# Patient Record
Sex: Female | Born: 1975 | Race: White | Hispanic: No | Marital: Married | Smoking: Never smoker
Health system: Southern US, Community
[De-identification: ages and names within clinical notes are randomized; demographics above are authoritative.]

## PROBLEM LIST (undated history)

## (undated) ENCOUNTER — Inpatient Hospital Stay (HOSPITAL_COMMUNITY): Payer: Self-pay

## (undated) DIAGNOSIS — D509 Iron deficiency anemia, unspecified: Secondary | ICD-10-CM

## (undated) DIAGNOSIS — M545 Low back pain: Secondary | ICD-10-CM

## (undated) DIAGNOSIS — Z9884 Bariatric surgery status: Secondary | ICD-10-CM

## (undated) DIAGNOSIS — Z87442 Personal history of urinary calculi: Secondary | ICD-10-CM

## (undated) DIAGNOSIS — K2970 Gastritis, unspecified, without bleeding: Secondary | ICD-10-CM

## (undated) DIAGNOSIS — Z9889 Other specified postprocedural states: Secondary | ICD-10-CM

## (undated) DIAGNOSIS — R112 Nausea with vomiting, unspecified: Secondary | ICD-10-CM

## (undated) DIAGNOSIS — G8929 Other chronic pain: Secondary | ICD-10-CM

## (undated) DIAGNOSIS — M1990 Unspecified osteoarthritis, unspecified site: Secondary | ICD-10-CM

## (undated) HISTORY — PX: DIAGNOSTIC LAPAROSCOPY: SUR761

---

## 1995-04-22 HISTORY — PX: WRIST GANGLION EXCISION: SUR520

## 1997-04-21 HISTORY — PX: CRYOTHERAPY: SHX1416

## 1999-04-22 HISTORY — PX: WISDOM TOOTH EXTRACTION: SHX21

## 2001-04-21 HISTORY — PX: GASTRIC BYPASS: SHX52

## 2009-04-30 ENCOUNTER — Emergency Department (HOSPITAL_COMMUNITY): Admission: EM | Admit: 2009-04-30 | Discharge: 2009-04-30 | Payer: Self-pay | Admitting: Family Medicine

## 2009-12-31 ENCOUNTER — Emergency Department (HOSPITAL_COMMUNITY): Admission: EM | Admit: 2009-12-31 | Discharge: 2009-12-31 | Payer: Self-pay | Admitting: Internal Medicine

## 2010-07-04 LAB — POCT I-STAT, CHEM 8
BUN: 6 mg/dL (ref 6–23)
Calcium, Ion: 1.16 mmol/L (ref 1.12–1.32)
Chloride: 104 mEq/L (ref 96–112)
Creatinine, Ser: 0.9 mg/dL (ref 0.4–1.2)
Glucose, Bld: 89 mg/dL (ref 70–99)
TCO2: 23 mmol/L (ref 0–100)

## 2010-07-04 LAB — CBC
HCT: 35.6 % — ABNORMAL LOW (ref 36.0–46.0)
Hemoglobin: 11.2 g/dL — ABNORMAL LOW (ref 12.0–15.0)
MCH: 26.9 pg (ref 26.0–34.0)
MCHC: 31.5 g/dL (ref 30.0–36.0)
RDW: 13.6 % (ref 11.5–15.5)

## 2010-07-04 LAB — URINE CULTURE
Culture  Setup Time: 201109121603
Culture: NO GROWTH

## 2010-07-04 LAB — URINALYSIS, ROUTINE W REFLEX MICROSCOPIC
Bilirubin Urine: NEGATIVE
Hgb urine dipstick: NEGATIVE
Specific Gravity, Urine: 1.01 (ref 1.005–1.030)
Urobilinogen, UA: 0.2 mg/dL (ref 0.0–1.0)
pH: 6 (ref 5.0–8.0)

## 2010-07-04 LAB — TYPE AND SCREEN
ABO/RH(D): A POS
Antibody Screen: NEGATIVE

## 2010-07-04 LAB — COMPREHENSIVE METABOLIC PANEL
CO2: 23 mEq/L (ref 19–32)
Calcium: 9 mg/dL (ref 8.4–10.5)
Creatinine, Ser: 0.8 mg/dL (ref 0.4–1.2)
GFR calc Af Amer: >60 mL/min (ref >60)
GFR calc non Af Amer: >60 mL/min (ref >60)
Glucose, Bld: 90 mg/dL (ref 70–99)
Sodium: 137 mEq/L (ref 135–145)
Total Protein: 6.8 g/dL (ref 6.0–8.3)

## 2010-07-04 LAB — APTT: aPTT: 25 seconds (ref 24–37)

## 2010-07-04 LAB — PROTIME-INR: Prothrombin Time: 13.5 seconds (ref 11.6–15.2)

## 2012-02-26 ENCOUNTER — Encounter (HOSPITAL_COMMUNITY): Payer: Self-pay | Admitting: *Deleted

## 2012-02-26 NOTE — H&P (Signed)
Shaneese Tait  DICTATION # [id number] CSN# [id number]   [name], MD 02/26/2012 4:10 PM

## 2012-02-28 ENCOUNTER — Ambulatory Visit (HOSPITAL_COMMUNITY)
Admission: AD | Admit: 2012-02-28 | Payer: Federal, State, Local not specified - PPO | Source: Ambulatory Visit | Admitting: Obstetrics and Gynecology

## 2012-02-28 ENCOUNTER — Encounter (HOSPITAL_COMMUNITY): Payer: Self-pay

## 2012-02-28 ENCOUNTER — Encounter (HOSPITAL_COMMUNITY): Payer: Self-pay | Admitting: *Deleted

## 2012-02-28 ENCOUNTER — Ambulatory Visit (HOSPITAL_COMMUNITY)
Admission: AD | Admit: 2012-02-28 | Discharge: 2012-02-28 | Disposition: A | Discharge status: Home / Self Care | Payer: Federal, State, Local not specified - PPO | Source: Ambulatory Visit | Attending: Obstetrics and Gynecology | Admitting: Obstetrics and Gynecology

## 2012-02-28 ENCOUNTER — Encounter (HOSPITAL_COMMUNITY)
Admission: AD | Disposition: A | Discharge status: Home / Self Care | Payer: Self-pay | Source: Ambulatory Visit | Attending: Obstetrics and Gynecology

## 2012-02-28 ENCOUNTER — Inpatient Hospital Stay (HOSPITAL_COMMUNITY): Payer: Federal, State, Local not specified - PPO

## 2012-02-28 DIAGNOSIS — O021 Missed abortion: Secondary | ICD-10-CM | POA: Insufficient documentation

## 2012-02-28 HISTORY — DX: Other specified postprocedural states: R11.2

## 2012-02-28 HISTORY — DX: Other specified postprocedural states: Z98.890

## 2012-02-28 HISTORY — PX: DILATION AND EVACUATION: SHX1459

## 2012-02-28 SURGERY — DILATION AND EVACUATION, UTERUS
Anesthesia: Monitor Anesthesia Care | Site: Vagina | Wound class: Clean Contaminated

## 2012-02-28 MED ORDER — SODIUM CHLORIDE 0.9 % IR SOLN
Status: DC | PRN
  Administered 2012-02-28: 1000 mL

## 2012-02-28 MED ORDER — SCOPOLAMINE 1 MG/3DAYS TD PT72
MEDICATED_PATCH | TRANSDERMAL | Status: DC | PRN
  Administered 2012-02-28: 1 via TRANSDERMAL

## 2012-02-28 MED ORDER — FENTANYL CITRATE 0.05 MG/ML IJ SOLN
INTRAMUSCULAR | Status: DC | PRN
  Administered 2012-02-28 (×2): 50 ug via INTRAVENOUS

## 2012-02-28 MED ORDER — ONDANSETRON HCL 4 MG/2ML IJ SOLN
INTRAMUSCULAR | Status: DC | PRN
  Administered 2012-02-28: 4 mg via INTRAVENOUS

## 2012-02-28 MED ORDER — FENTANYL CITRATE 0.05 MG/ML IJ SOLN
25.0000 ug | INTRAMUSCULAR | Status: DC | PRN
  Administered 2012-02-28: 25 ug via INTRAVENOUS
  Administered 2012-02-28: 50 ug via INTRAVENOUS

## 2012-02-28 MED ORDER — FAMOTIDINE IN NACL 20-0.9 MG/50ML-% IV SOLN
20.0000 mg | Freq: Once | INTRAVENOUS | Status: AC
  Administered 2012-02-28: 20 mg via INTRAVENOUS
  Filled 2012-02-28: qty 50

## 2012-02-28 MED ORDER — LACTATED RINGERS IV SOLN
INTRAVENOUS | Status: DC | PRN
  Administered 2012-02-28: 10:00:00 via INTRAVENOUS

## 2012-02-28 MED ORDER — PROPOFOL 10 MG/ML IV EMUL
INTRAVENOUS | Status: DC | PRN
  Administered 2012-02-28 (×2): 50 mg via INTRAVENOUS
  Administered 2012-02-28 (×2): 25 mg via INTRAVENOUS
  Administered 2012-02-28: 50 mg via INTRAVENOUS

## 2012-02-28 MED ORDER — MIDAZOLAM HCL 2 MG/2ML IJ SOLN: 0.5000 mg | Freq: Once | INTRAMUSCULAR | Status: DC | PRN

## 2012-02-28 MED ORDER — LIDOCAINE HCL 1 % IJ SOLN
INTRAMUSCULAR | Status: DC | PRN
  Administered 2012-02-28: 8 mL

## 2012-02-28 MED ORDER — PROMETHAZINE HCL 25 MG/ML IJ SOLN: 6.2500 mg | INTRAMUSCULAR | Status: DC | PRN

## 2012-02-28 MED ORDER — MEPERIDINE HCL 25 MG/ML IJ SOLN: 6.2500 mg | INTRAMUSCULAR | Status: DC | PRN

## 2012-02-28 MED ORDER — DEXAMETHASONE SODIUM PHOSPHATE 4 MG/ML IJ SOLN
INTRAMUSCULAR | Status: DC | PRN
  Administered 2012-02-28: 10 mg via INTRAVENOUS

## 2012-02-28 MED ORDER — HYDROCODONE-IBUPROFEN 7.5-200 MG PO TABS: 1.0000 | ORAL_TABLET | Freq: Three times a day (TID) | ORAL | Status: DC | PRN

## 2012-02-28 MED ORDER — CEFAZOLIN SODIUM-DEXTROSE 2-3 GM-% IV SOLR
INTRAVENOUS | Status: DC | PRN
  Administered 2012-02-28: 2 g via INTRAVENOUS

## 2012-02-28 MED ORDER — MIDAZOLAM HCL 5 MG/5ML IJ SOLN
INTRAMUSCULAR | Status: DC | PRN
  Administered 2012-02-28: 2 mg via INTRAVENOUS

## 2012-02-28 MED ORDER — KETOROLAC TROMETHAMINE 30 MG/ML IJ SOLN
INTRAMUSCULAR | Status: DC | PRN
  Administered 2012-02-28: 30 mg via INTRAVENOUS

## 2012-02-28 MED ORDER — CITRIC ACID-SODIUM CITRATE 334-500 MG/5ML PO SOLN
30.0000 mL | Freq: Once | ORAL | Status: AC
  Administered 2012-02-28: 30 mL via ORAL
  Filled 2012-02-28: qty 15

## 2012-02-28 MED ORDER — LACTATED RINGERS IV SOLN
INTRAVENOUS | Status: DC
  Administered 2012-02-28 (×2): 125 mL/h via INTRAVENOUS

## 2012-02-28 MED ORDER — KETOROLAC TROMETHAMINE 30 MG/ML IJ SOLN: 15.0000 mg | Freq: Once | INTRAMUSCULAR | Status: DC | PRN

## 2012-02-28 MED ORDER — CEFAZOLIN SODIUM-DEXTROSE 2-3 GM-% IV SOLR: 2.0000 g | INTRAVENOUS | Status: DC

## 2012-02-28 SURGICAL SUPPLY — 17 items
CATH ROBINSON RED A/P 16FR (CATHETERS) ×2 IMPLANT
CLOTH BEACON ORANGE TIMEOUT ST (SAFETY) ×2 IMPLANT
DECANTER SPIKE VIAL GLASS SM (MISCELLANEOUS) ×2 IMPLANT
GLOVE BIO SURGEON STRL SZ7 (GLOVE) ×4 IMPLANT
KIT BERKELEY 1ST TRIMESTER 3/8 (MISCELLANEOUS) ×2 IMPLANT
NEEDLE SPNL 22GX3.5 QUINCKE BK (NEEDLE) ×2 IMPLANT
NS IRRIG 1000ML POUR BTL (IV SOLUTION) ×2 IMPLANT
PACK VAGINAL MINOR WOMEN LF (CUSTOM PROCEDURE TRAY) ×2 IMPLANT
PAD OB MATERNITY 4.3X12.25 (PERSONAL CARE ITEMS) ×2 IMPLANT
PAD PREP 24X48 CUFFED NSTRL (MISCELLANEOUS) ×2 IMPLANT
SET BERKELEY SUCTION TUBING (SUCTIONS) ×2 IMPLANT
SYR CONTROL 10ML LL (SYRINGE) ×2 IMPLANT
TOWEL OR 17X24 6PK STRL BLUE (TOWEL DISPOSABLE) ×4 IMPLANT
VACURETTE 10 RIGID CVD (CANNULA) IMPLANT
VACURETTE 7MM CVD STRL WRAP (CANNULA) ×2 IMPLANT
VACURETTE 8 RIGID CVD (CANNULA) IMPLANT
VACURETTE 9 RIGID CVD (CANNULA) IMPLANT

## 2012-02-28 NOTE — Anesthesia Postprocedure Evaluation (Signed)
Anesthesia Post Note  Patient: [name] Curry  Procedure(s) Performed: Procedure(s) (LRB): DILATATION AND EVACUATION (N/A)  Anesthesia type: MAC  Patient location: PACU  Post pain: Pain level controlled  Post assessment: Post-op Vital signs reviewed  Last Vitals:  Filed Vitals:   02/28/12 1045  BP: 98/56  Pulse: 78  Temp:   Resp: 20    Post vital signs: Reviewed  Level of consciousness: sedated  Complications: No apparent anesthesia complications

## 2012-02-28 NOTE — Progress Notes (Signed)
The patient was re-examined with no change in status 

## 2012-02-28 NOTE — Anesthesia Preprocedure Evaluation (Signed)
Anesthesia Evaluation  Patient identified by MRN, date of birth, ID band Patient awake    Reviewed: Allergy & Precautions, H&P , Patient's Chart, lab work & pertinent test results, reviewed documented beta blocker date and time   History of Anesthesia Complications (+) PONV  Airway Mallampati: II TM Distance: >3 FB Neck ROM: full    Dental No notable dental hx.    Pulmonary neg pulmonary ROS,  breath sounds clear to auscultation  Pulmonary exam normal       Cardiovascular Exercise Tolerance: Good negative cardio ROS  Rhythm:regular Rate:Normal     Neuro/Psych negative neurological ROS  negative psych ROS   GI/Hepatic negative GI ROS, Neg liver ROS,   Endo/Other  negative endocrine ROS  Renal/GU negative Renal ROS     Musculoskeletal   Abdominal   Peds  Hematology negative hematology ROS (+)   Anesthesia Other Findings   Reproductive/Obstetrics negative OB ROS                           Anesthesia Physical Anesthesia Plan  ASA: I  Anesthesia Plan: MAC   Post-op Pain Management:    Induction:   Airway Management Planned:   Additional Equipment:   Intra-op Plan:   Post-operative Plan:   Informed Consent: I have reviewed the patients History and Physical, chart, labs and discussed the procedure including the risks, benefits and alternatives for the proposed anesthesia with the patient or authorized representative who has indicated his/her understanding and acceptance.   Dental Advisory Given  Plan Discussed with: CRNA, Surgeon and Anesthesiologist  Anesthesia Plan Comments:         Anesthesia Quick Evaluation

## 2012-02-28 NOTE — Op Note (Signed)
Preoperative diagnosis: Missed AB  Postoperative diagnosis: Same  Procedure: D&E  Surgeon: Marcelle Overlie  Complications: None  EBL: Less than 50 cc  Specimens removed: POC, to pathology  Procedure and findings:  Patient taken the operating room after an adequate level of sedation was obtained the legs in stirrups the bladder was drained, EUA carried out uterus was 8 weeks size mid position adnexa negative. Appropriate timeout for taken at that point. Speculum was positioned, paracervical block was then created by infiltrating at 3 and 9:00 submucosally 5-7 cc of 1% Xylocaine at 3 and 9:00 after negative aspiration. The uterus is then sounded to 9 cm progressively dilated to a 27 Pratt dilator, a #7 curved suction curet was then used to curette a moderate amount of tissue, when no further tissue could be removed a medium blunt curette was used to explore the cavity revealing it to be clean there was minimal bleeding she tolerated this well went to recovery room in good condition.  Dictated with [facility]  Abriella Filkins M. Milana Obey.D.

## 2012-02-28 NOTE — Transfer of Care (Signed)
Immediate Anesthesia Transfer of Care Note  Patient: [name] Curry  Procedure(s) Performed: Procedure(s) (LRB) with comments: DILATATION AND EVACUATION (N/A)  Patient Location: PACU  Anesthesia Type:MAC  Level of Consciousness: awake, alert  and oriented  Airway & Oxygen Therapy: Patient Spontanous Breathing  Post-op Assessment: Report given to PACU RN and Post -op Vital signs reviewed and stable  Post vital signs: Reviewed and stable  Complications: No apparent anesthesia complications

## 2012-02-28 NOTE — MAU Note (Signed)
Pt here for D&E. Small amount of spotting this am. No pain

## 2012-03-01 ENCOUNTER — Encounter (HOSPITAL_COMMUNITY): Payer: Self-pay | Admitting: Obstetrics and Gynecology

## 2012-03-01 NOTE — H&P (Signed)
[name] Curry, [name] Curry               ACCOUNT NO.:  [id number]  MEDICAL RECORD NO.:  [id number]  LOCATION:                                 FACILITY:  PHYSICIAN:  Duke Salvia. Marcelle Overlie, M.D.DATE OF BIRTH:  24-Aug-1975  DATE OF ADMISSION:  02/28/2012 DATE OF DISCHARGE:                             HISTORY & PHYSICAL   CHIEF COMPLAINT:  Missed AB.  HISTORY OF PRESENT ILLNESS:  A 36 year old G3, P1, A1, this patient has had a gastric bypass in the past.  Last year had __________ that was normal except for possible adenomyosis.  More recently presented for pregnancy evaluation.  First ultrasound showed sac with a yolk sac but no FHR, she experienced some increased bleeding and cramping during the ensuing 1 week and followup ultrasound in the office November 7 showed no fetal pole, no change in the size of the sac consistent with missed AB, presents now for D and E.  This procedure including risks related to bleeding, infection, and other complications may require additional surgery all reviewed with her which she understands and accepts.  ALLERGIES:  None.  CURRENT MEDICATIONS:  Prenatal vitamins.  OBSTETRICAL HISTORY:  One vaginal delivery in 1999, history of gastric bypass in 2003.  REVIEW OF SYSTEMS:  Significant for prior history of abnormal Pap smear, anemia, UTI.  FAMILY HISTORY:  Significant for diverticulosis, diabetes, hypertension, and breast cancer.  SOCIAL HISTORY:  Denies drug or tobacco use.  She does drink socially. She is divorced.  PHYSICAL EXAMINATION:  VITAL SIGNS:  Temp 98.2, blood pressure 120/78. HEENT:  Unremarkable. NECK:  Supple without masses. LUNGS:  Clear. CARDIOVASCULAR:  Regular rate and rhythm without murmurs, rubs, gallops noted. BREASTS:  Without masses. ABDOMEN:  Soft, flat, nontender. PELVIC:  Normal external genitalia.  Vagina and cervix clear.  Uterus was 6-week size.  Adnexa negative. EXTREMITIES:  Unremarkable. NEUROLOGIC:   Unremarkable.  IMPRESSION:  Missed abortion.  PLAN:  D and E.  Procedure and risks discussed as above.     Sylvie Mifsud M. Marcelle Overlie, M.D.     [facility]/MEDQ  D:  02/26/2012  T:  02/27/2012  Job:  [id number]

## 2012-04-21 NOTE — L&D Delivery Note (Signed)
Delivery Note  SVD viable female Apgars 9,9 over intact perineum. Nuchal x 2 reduced.  Placenta delivered spontaneously intact with 3VC. good support and hemostasis noted.  PH art was sent.  [place] cord blood was not available  Placenta to path.  Mother and baby were doing well.  EBL 300cc  Candice Camp, MD

## 2012-07-08 LAB — OB RESULTS CONSOLE ABO/RH: RH Type: POSITIVE

## 2012-07-08 LAB — OB RESULTS CONSOLE HIV ANTIBODY (ROUTINE TESTING): HIV: NONREACTIVE

## 2012-07-08 LAB — OB RESULTS CONSOLE RPR: RPR: NONREACTIVE

## 2012-07-08 LAB — OB RESULTS CONSOLE RUBELLA ANTIBODY, IGM: Rubella: IMMUNE

## 2012-07-08 LAB — OB RESULTS CONSOLE HEPATITIS B SURFACE ANTIGEN: Hepatitis B Surface Ag: NEGATIVE

## 2012-09-23 ENCOUNTER — Other Ambulatory Visit: Payer: Self-pay

## 2012-10-02 ENCOUNTER — Encounter (HOSPITAL_COMMUNITY): Payer: Self-pay | Admitting: Obstetrics and Gynecology

## 2012-10-02 ENCOUNTER — Inpatient Hospital Stay (HOSPITAL_COMMUNITY)
Admission: AD | Admit: 2012-10-02 | Discharge: 2012-10-02 | Disposition: A | Discharge status: Home / Self Care | Payer: Federal, State, Local not specified - PPO | Source: Ambulatory Visit | Attending: Obstetrics and Gynecology | Admitting: Obstetrics and Gynecology

## 2012-10-02 DIAGNOSIS — M549 Dorsalgia, unspecified: Secondary | ICD-10-CM

## 2012-10-02 DIAGNOSIS — O9989 Other specified diseases and conditions complicating pregnancy, childbirth and the puerperium: Secondary | ICD-10-CM

## 2012-10-02 DIAGNOSIS — O99891 Other specified diseases and conditions complicating pregnancy: Secondary | ICD-10-CM | POA: Insufficient documentation

## 2012-10-02 DIAGNOSIS — G8929 Other chronic pain: Secondary | ICD-10-CM | POA: Insufficient documentation

## 2012-10-02 DIAGNOSIS — M79609 Pain in unspecified limb: Secondary | ICD-10-CM | POA: Insufficient documentation

## 2012-10-02 MED ORDER — OXYCODONE-ACETAMINOPHEN 5-325 MG PO TABS
1.0000 | ORAL_TABLET | Freq: Once | ORAL | Status: AC
  Administered 2012-10-02: 1 via ORAL
  Filled 2012-10-02: qty 1

## 2012-10-02 MED ORDER — OXYCODONE-ACETAMINOPHEN 5-325 MG PO TABS: 1.0000 | ORAL_TABLET | ORAL | Status: DC | PRN

## 2012-10-02 MED ORDER — CYCLOBENZAPRINE HCL 10 MG PO TABS
10.0000 mg | ORAL_TABLET | Freq: Once | ORAL | Status: AC
  Administered 2012-10-02: 10 mg via ORAL
  Filled 2012-10-02: qty 1

## 2012-10-02 MED ORDER — CYCLOBENZAPRINE HCL 10 MG PO TABS: 10.0000 mg | ORAL_TABLET | Freq: Three times a day (TID) | ORAL | Status: DC | PRN

## 2012-10-02 NOTE — MAU Note (Signed)
Pt presents with complaints of pain in her back that radiates to her right lower side of her back. States she has had this pain all week which has gotten worse since last night.
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
## 2012-12-18 ENCOUNTER — Encounter (HOSPITAL_COMMUNITY): Payer: Self-pay | Admitting: *Deleted

## 2012-12-18 ENCOUNTER — Inpatient Hospital Stay (HOSPITAL_COMMUNITY): Payer: Federal, State, Local not specified - PPO

## 2012-12-18 ENCOUNTER — Inpatient Hospital Stay (HOSPITAL_COMMUNITY)
Admission: AD | Admit: 2012-12-18 | Discharge: 2012-12-19 | Disposition: A | Discharge status: Home / Self Care | Payer: Federal, State, Local not specified - PPO | Source: Ambulatory Visit | Attending: Obstetrics and Gynecology | Admitting: Obstetrics and Gynecology

## 2012-12-18 DIAGNOSIS — R109 Unspecified abdominal pain: Secondary | ICD-10-CM | POA: Insufficient documentation

## 2012-12-18 DIAGNOSIS — O26899 Other specified pregnancy related conditions, unspecified trimester: Secondary | ICD-10-CM

## 2012-12-18 DIAGNOSIS — N949 Unspecified condition associated with female genital organs and menstrual cycle: Secondary | ICD-10-CM | POA: Insufficient documentation

## 2012-12-18 DIAGNOSIS — O9989 Other specified diseases and conditions complicating pregnancy, childbirth and the puerperium: Secondary | ICD-10-CM

## 2012-12-18 DIAGNOSIS — O4700 False labor before 37 completed weeks of gestation, unspecified trimester: Secondary | ICD-10-CM | POA: Insufficient documentation

## 2012-12-18 LAB — URINALYSIS, ROUTINE W REFLEX MICROSCOPIC
Bilirubin Urine: NEGATIVE
Glucose, UA: NEGATIVE mg/dL
Ketones, ur: NEGATIVE mg/dL
Leukocytes, UA: NEGATIVE
Protein, ur: NEGATIVE mg/dL
pH: 6.5 (ref 5.0–8.0)

## 2012-12-18 LAB — URINE MICROSCOPIC-ADD ON

## 2012-12-18 MED ORDER — IBUPROFEN 600 MG PO TABS
600.0000 mg | ORAL_TABLET | Freq: Once | ORAL | Status: AC
  Administered 2012-12-18: 600 mg via ORAL
  Filled 2012-12-18: qty 1

## 2012-12-18 MED ORDER — NIFEDIPINE 10 MG PO CAPS
10.0000 mg | ORAL_CAPSULE | Freq: Once | ORAL | Status: AC
  Administered 2012-12-18: 10 mg via ORAL
  Filled 2012-12-18: qty 1

## 2012-12-18 NOTE — MAU Note (Signed)
Patient is in with c/o new onset lower abdominal pain and pelvic/vaginal pressure. She denies vaginal bleeding, abnormal discharge, lof or dysuria. She reports good fetal movement.

## 2012-12-18 NOTE — MAU Provider Note (Signed)
History     CSN: [id number]  Arrival date and time: 12/18/12 2014   First Provider Initiated Contact with Patient 12/18/12 2057      Chief Complaint  Patient presents with  . Pelvic Pain  . Abdominal Pain   HPI This is a 37 y.o. female at [redacted]w[redacted]d who presents with c/o lower abdominal pain and vaginal pressure (like baby is pushing feet in to her vagina).  Denies leaking or bleeding.  RN Note: Patient is in with c/o new onset lower abdominal pain and pelvic/vaginal pressure. She denies vaginal bleeding, abnormal discharge, lof or dysuria. She reports good fetal movement.        History is remarkable for a full term delivery with no history of PTL. She did have Cryosurgery and gastric bypass in past. Had a marginal previa which resolved to low lying as of this week.   OB History   Grav Para Term Preterm Abortions TAB SAB Ect Mult Living   3 1 1  1  1   1       Past Medical History  Diagnosis Date  . PONV (postoperative nausea and vomiting)     w general anesthesia  . Termination of pregnancy   . SVD (spontaneous vaginal delivery) 1999    x 1  . Anemia   . Kidney stones, calcium oxalate     passed stone  - no surgery required    Past Surgical History  Procedure Laterality Date  . Gastric bypass surgery  2003  . Ganglion cyst removed left wrist  1997  . Wisdom tooth extraction  2001  . Cholecystectomy      w gastrc bypass surgery  . Cryotherapy    . Dilation and curettage of uterus      w termination  . Dilation and evacuation  02/28/2012    Procedure: DILATATION AND EVACUATION;  Surgeon: [name], MD;  Location: [facility];  Service: Gynecology;  Laterality: N/A;    History reviewed. No pertinent family history.  History  Substance Use Topics  . Smoking status: Never Smoker   . Smokeless tobacco: Never Used  . Alcohol Use: No     Comment: socially    Allergies:  Allergies  Allergen Reactions  . Doxycycline Nausea And Vomiting    Patient does not  want to take this medication.    Prescriptions prior to admission  Medication Sig Dispense Refill  . oxyCODONE-acetaminophen (PERCOCET/ROXICET) 5-325 MG per tablet Take 1 tablet by mouth every 4 (four) hours as needed for pain. May cause sedation and constipation.  12 tablet  0  . Prenatal Vit-Fe Fumarate-FA (PRENATAL MULTIVITAMIN) TABS Take 1 tablet by mouth every morning.       Marland Kitchen acetaminophen (TYLENOL) 500 MG tablet Take 1,000 mg by mouth every 6 (six) hours as needed for pain.      . cyclobenzaprine (FLEXERIL) 10 MG tablet Take 1 tablet (10 mg total) by mouth 3 (three) times daily as needed for muscle spasms. May cause sedation  20 tablet  0    Review of Systems  Constitutional: Negative for fever, chills and malaise/fatigue.  Gastrointestinal: Positive for abdominal pain. Negative for nausea, vomiting, diarrhea and constipation.  Genitourinary: Negative for dysuria.  Neurological: Negative for dizziness and weakness.   Physical Exam   Blood pressure 112/64, pulse 79, temperature 98.2 F (36.8 C), temperature source Oral, resp. rate 16, height 5\' 5"  (1.651 m), weight 80.457 kg (177 lb 6 oz), last menstrual period 07/06/2012, SpO2 100.00%.  Physical Exam  Constitutional: She is oriented to person, place, and time. She appears well-developed and well-nourished. No distress.  HENT:  Head: Normocephalic.  Cardiovascular: Normal rate.   Respiratory: Effort normal.  GI: Soft. She exhibits no distension and no mass. There is no tenderness. There is no rebound and no guarding.  Genitourinary: Vagina normal and uterus normal. No vaginal discharge found.  Cervix long and closed  Musculoskeletal: Normal range of motion.  Neurological: She is alert and oriented to person, place, and time.  Skin: Skin is warm and dry.  Psychiatric: She has a normal mood and affect.    MAU Course  Procedures  MDM Discussed with Dr Henderson Cloud, who said pt could go home. Then we noted contractions on  monitor. Every 3-4 minutes, mild. Motrin 600mg  given without much change. US done to check Cx length (3.6cm) and Procardia 10mg  given. Then pt states UCs much improved. Reconsulted Dr Henderson Cloud who said if pt feels UCs are better, may go home and follow in office.   Assessment and Plan  A:  SIUP at [redacted]w[redacted]d       Preterm contractions without cervical change.   P:  Discharge home       Follow up in office  [facility] 12/18/2012, 9:07 PM

## 2012-12-20 LAB — URINE CULTURE
Colony Count: NO GROWTH
Special Requests: NORMAL

## 2013-01-28 ENCOUNTER — Other Ambulatory Visit (HOSPITAL_COMMUNITY): Payer: Self-pay | Admitting: Obstetrics and Gynecology

## 2013-01-28 DIAGNOSIS — O358XX0 Maternal care for other (suspected) fetal abnormality and damage, not applicable or unspecified: Secondary | ICD-10-CM

## 2013-01-28 DIAGNOSIS — O09529 Supervision of elderly multigravida, unspecified trimester: Secondary | ICD-10-CM

## 2013-02-02 ENCOUNTER — Encounter (HOSPITAL_COMMUNITY): Payer: Self-pay

## 2013-02-02 ENCOUNTER — Ambulatory Visit (HOSPITAL_COMMUNITY)
Admission: RE | Admit: 2013-02-02 | Discharge: 2013-02-02 | Disposition: A | Discharge status: Home / Self Care | Payer: Federal, State, Local not specified - PPO | Source: Ambulatory Visit | Attending: Obstetrics and Gynecology | Admitting: Obstetrics and Gynecology

## 2013-02-02 DIAGNOSIS — O09529 Supervision of elderly multigravida, unspecified trimester: Secondary | ICD-10-CM | POA: Insufficient documentation

## 2013-02-02 DIAGNOSIS — Z1389 Encounter for screening for other disorder: Secondary | ICD-10-CM | POA: Insufficient documentation

## 2013-02-02 DIAGNOSIS — O358XX0 Maternal care for other (suspected) fetal abnormality and damage, not applicable or unspecified: Secondary | ICD-10-CM

## 2013-02-02 DIAGNOSIS — O3440 Maternal care for other abnormalities of cervix, unspecified trimester: Secondary | ICD-10-CM | POA: Insufficient documentation

## 2013-02-02 DIAGNOSIS — Z363 Encounter for antenatal screening for malformations: Secondary | ICD-10-CM | POA: Insufficient documentation

## 2013-02-02 NOTE — Progress Notes (Addendum)
Genetic Counseling  High-Risk Gestation Note  Appointment Date:  02/02/2013 Referred By: Leslie Andrea, MD Date of Birth:  March 02, 1976 Partner:  [name] Curry  Pregnancy History: J.P. Estimated Date of Delivery: 04/12/13 Estimated Gestational Age: crowd Attending: Cathlean Cower, MD   Ms. [name] Curry, and her husband, Mr. [name] Curry, were seen for genetic counseling regarding a maternal age of 37 and abnormal ultrasound findings.  They were counseled regarding maternal age and the association with risk for chromosome conditions due to nondisjunction with aging of the ova.   We reviewed chromosomes, nondisjunction, and the associated 1 in 39 risk for fetal aneuploidy related to a maternal age of 37 y.o. at crowd weeks gestation.  They were counseled that the risk for aneuploidy decreases as gestational age increases, accounting for those pregnancies which spontaneously abort.  We specifically discussed Down syndrome (trisomy 60), trisomies 26 and 54, and sex chromosome aneuploidies (47,XX and 47,XXY) including the common features and prognoses of each.   We also reviewed the normal results of [name] Curry noninvasive prenatal screening (NIPS).  [name] Curry had MaterniT21 testing through [employer].  We reviewed the benefits and limitations of this technology.  They were counseled that NIPS, also known as cell free DNA (cf DNA) testing, is a technology that analyzes placental DNA in maternal circulation to detect fetal aneuploidy.  They were counseled that this technology has a high detection for aneuploidy and a low false positive rate; however, they understand that NIPS is not a diagnostic test and that both false positives and negatives have been reported.    We also reviewed the results of [name] Curry's most recent fetal ultrasound.  Ultrasound was performed at [facility] on January 27, 2013.  At that time, the fetus was noted to have a possible double bubble  appearance of the stomach.  [facility]trasound [facility] performed [facility] at [facility].  The report will be documented separately.  We reviewed that the ultrasound today revealed approximately 3 intraabdominal cysts.  While a double bubble, duodenal atresia, cannot be definitely ruled out at this time, the ultrasound findings are not consistent with classic appearing double bubble.  We reviewed that intraabdominal cysts can occur due to a variety of causes including: enteric duplication cysts, renal cysts, splenic cysts, intestinal atresia, and neuroblastoma.  Although a specific cause is difficult to determine at this time, fetal enteric duplication cysts are at the top of the list of differential diagnoses.  We reviewed that the majority of these findings are not associated with an increase in risk for fetal aneuploidy, with the exception of duodenal atresia.  We again reviewed that this is an unlikely cause for the cystic structure in the abdomen and that atresia of other segments of the intestines are not associated with risk for aneuploidy (Down syndrome).  We discussed the option of serial sonography in order to help determine a specific diagnosis.    We reviewed the availability of amniocentesis with microarray analysis.   We reviewed the risks, benefits, and limitations of amniocentesis, including the associated 1 in 300 risk for spontaneous abortion.  We reviewed that a fetal chromosome analysis can be performed with the cells obtained via amniocentesis.  They were counseled that microarray analysis is a molecular based technique in which a test sample of DNA (fetal) is compared to a reference (normal) genome in order to determine if the test sample has any extra or missing genetic information.  Microarray analysis allows  for the detection of genetic deletions and duplications that are 100 times smaller than those identified by routine chromosome analysis.  We discussed that recent publications  show that approximately 6% of patients with an abnormal fetal ultrasound and a normal fetal karyotype had a significant microdeletion/microduplication detected by prenatal microarray analysis.  After thoughtful consideration of this option, the [name] Curry declined.  Given [name] Curry advanced gestational age, they wish to defer to postnatal chromosome analysis, if warranted.  They understand that the prognosis depends on the underlying diagnosis and etiology.  [name] Curry was provided with written information regarding cystic fibrosis (CF) including the carrier frequency and incidence in the Caucasian population, the availability of carrier testing and prenatal diagnosis if indicated.  In addition, we discussed that CF is routinely screened for as part of the [place] newborn screening panel.  She declined testing today.   Both family histories were reviewed and found to be noncontributory for birth defects and known genetic conditions.  [name] Curry reported that her paternal grandparents were first cousins and that they had a daughter ([name] Curry aunt) who had mild mental retardation of unknown etiology.  This aunt also had three children who had intellectual disability of varying degrees.  They were counseled that there are many different causes of intellectual disabilities including environmental, multifactorial, and genetic etiologies.  We discussed that a specific diagnosis for intellectual disability can be determined in approximately 50% of these individuals.  In the remaining 50% of individuals, a diagnosis may never be determined.  Regarding genetic causes, we discussed that chromosome aberrations (aneuploidy, deletions, duplications, insertions, and translocations) are responsible for a small percentage of individuals with intellectual disability.  Many individuals with chromosome aberrations have additional differences, including congenital anomalies or minor dysmorphisms.  Likewise, single gene conditions  are the underlying cause of intellectual delay in some families.  We discussed that many gene conditions have intellectual disability as a feature, but also often include other physical or medical differences.  Specifically, we reviewed fragile X syndrome and the X-linked inheritance of this condition.  We discussed the option of FMR1 (the gene that when altered causes fragile X syndrome) analysis to determine whether Fragile X syndrome is the cause of intellectual disability in this family.  [name] Curry declined FMR1 analysis today.  In addition, we discussed the option of this family having an evaluation by a medical geneticist to help determine the cause of the familial intellectual disability.  [name] Curry will notify me if they are interested in a referral to medical genetics.  We discussed that without more specific information, it is difficult to provide an accurate risk assessment.  Further genetic counseling is warranted if more information is obtained.  [name] Curry denied exposure to environmental toxins or chemical agents. She denied the use of alcohol, tobacco or street drugs. She denied significant viral illnesses during the course of her pregnancy.   I counseled this couple regarding the above risks and available options.  The approximate face-to-face time with the genetic counselor was 44 minutes.  Donald Prose, MS Certified Genetic Counselor

## 2013-02-02 NOTE — Progress Notes (Signed)
Ms. Broyles was seen for ultrasound appointment today.  Please see [facility] report for details.

## 2013-02-17 ENCOUNTER — Inpatient Hospital Stay (HOSPITAL_COMMUNITY)
Admission: AD | Admit: 2013-02-17 | Discharge: 2013-02-17 | Disposition: A | Discharge status: Home / Self Care | Payer: Federal, State, Local not specified - PPO | Source: Ambulatory Visit | Attending: Obstetrics and Gynecology | Admitting: Obstetrics and Gynecology

## 2013-02-17 ENCOUNTER — Other Ambulatory Visit: Payer: Self-pay

## 2013-02-17 DIAGNOSIS — R079 Chest pain, unspecified: Secondary | ICD-10-CM

## 2013-02-17 DIAGNOSIS — O99891 Other specified diseases and conditions complicating pregnancy: Secondary | ICD-10-CM | POA: Insufficient documentation

## 2013-02-17 DIAGNOSIS — R0602 Shortness of breath: Secondary | ICD-10-CM | POA: Insufficient documentation

## 2013-02-17 LAB — CBC WITH DIFFERENTIAL/PLATELET
Basophils Relative: 0 % (ref 0–1)
Eosinophils Absolute: 0.1 10*3/uL (ref 0.0–0.7)
MCH: 30.6 pg (ref 26.0–34.0)
MCHC: 33.6 g/dL (ref 30.0–36.0)
Monocytes Absolute: 0.9 10*3/uL (ref 0.1–1.0)
Monocytes Relative: 7 % (ref 3–12)
RBC: 3.66 MIL/uL — ABNORMAL LOW (ref 3.87–5.11)
WBC: 13.3 10*3/uL — ABNORMAL HIGH (ref 4.0–10.5)

## 2013-02-17 LAB — COMPREHENSIVE METABOLIC PANEL
Albumin: 2.5 g/dL — ABNORMAL LOW (ref 3.5–5.2)
BUN: 6 mg/dL (ref 6–23)
Chloride: 103 mEq/L (ref 96–112)
Creatinine, Ser: 0.63 mg/dL (ref 0.50–1.10)
GFR calc Af Amer: >90 mL/min (ref >90)
Glucose, Bld: 68 mg/dL — ABNORMAL LOW (ref 70–99)
Total Bilirubin: 0.2 mg/dL — ABNORMAL LOW (ref 0.3–1.2)

## 2013-02-17 NOTE — MAU Provider Note (Signed)
History     CSN: [id number]  Arrival date and time: 02/17/13 1522   None     Chief Complaint  Patient presents with  . Shortness of Breath  . Chest Pain   Shortness of Breath Associated symptoms include chest pain.  Chest Pain  Associated symptoms include shortness of breath.    [name] Curry is a 37 y.o. [phone] at [redacted]w[redacted]d who was sent from the office with shortness or breath and chest pain. She states that it feels like pressure, and that she just "can't get a good breath". She states that it has been going on for about 2 weeks now. She denies any asthma or allergies. She denies any calf pain or swelling.   Past Medical History  Diagnosis Date  . PONV (postoperative nausea and vomiting)     w general anesthesia  . Termination of pregnancy   . SVD (spontaneous vaginal delivery) 1999    x 1  . Anemia   . Kidney stones, calcium oxalate     passed stone  - no surgery required    Past Surgical History  Procedure Laterality Date  . Gastric bypass surgery  2003  . Ganglion cyst removed left wrist  1997  . Wisdom tooth extraction  2001  . Cholecystectomy      w gastrc bypass surgery  . Cryotherapy    . Dilation and curettage of uterus      w termination  . Dilation and evacuation  02/28/2012    Procedure: DILATATION AND EVACUATION;  Surgeon: [name], MD;  Location: [facility];  Service: Gynecology;  Laterality: N/A;    No family history on file.  History  Substance Use Topics  . Smoking status: Never Smoker   . Smokeless tobacco: Never Used  . Alcohol Use: No     Comment: socially    Allergies:  Allergies  Allergen Reactions  . Doxycycline Nausea And Vomiting    Patient does not want to take this medication.    Prescriptions prior to admission  Medication Sig Dispense Refill  . diphenhydrAMINE (BENADRYL) 25 mg capsule Take 25 mg by mouth every 6 (six) hours as needed for itching.      Marland Kitchen oxyCODONE-acetaminophen (PERCOCET/ROXICET) 5-325 MG per tablet  Take 1 tablet by mouth every 4 (four) hours as needed for pain. May cause sedation and constipation.  12 tablet  0  . Prenatal Vit-Fe Fumarate-FA (PRENATAL MULTIVITAMIN) TABS Take 1 tablet by mouth every morning.         Review of Systems  Respiratory: Positive for shortness of breath.   Cardiovascular: Positive for chest pain.   Physical Exam   Blood pressure 102/66, pulse 86, temperature 98.1 F (36.7 C), resp. rate 18, height 5\' 5"  (1.651 m), weight 83.734 kg (184 lb 9.6 oz), last menstrual period 07/06/2012, SpO2 100.00%.  Physical Exam  Nursing note and vitals reviewed. Constitutional: She is oriented to person, place, and time. She appears well-developed and well-nourished. No distress.  Cardiovascular: Normal rate, regular rhythm and normal heart sounds.   No murmur heard. Respiratory: Effort normal. No respiratory distress. She has no wheezes. She exhibits no tenderness.  GI: Soft. She exhibits no distension.  Musculoskeletal: She exhibits no edema and no tenderness.  Negative homan's sign   Neurological: She is alert and oriented to person, place, and time.  Skin: Skin is warm and dry.  Psychiatric: She has a normal mood and affect.    MAU Course  Procedures  Results  for orders placed during the hospital encounter of 02/17/13 (from the past 24 hour(s))  CBC WITH DIFFERENTIAL     Status: Abnormal   Collection Time    02/17/13  3:41 PM      Result Value Range   WBC 13.3 (*) 4.0 - 10.5 K/uL   RBC 3.66 (*) 3.87 - 5.11 MIL/uL   Hemoglobin 11.2 (*) 12.0 - 15.0 g/dL   HCT 16.1 (*) 09.6 - 04.5 %   MCV 91.0  78.0 - 100.0 fL   MCH 30.6  26.0 - 34.0 pg   MCHC 33.6  30.0 - 36.0 g/dL   RDW 40.9  81.1 - 91.4 %   Platelets 233  150 - 400 K/uL   Neutrophils Relative % 71  43 - 77 %   Neutro Abs 9.4 (*) 1.7 - 7.7 K/uL   Lymphocytes Relative 22  12 - 46 %   Lymphs Abs 2.9  0.7 - 4.0 K/uL   Monocytes Relative 7  3 - 12 %   Monocytes Absolute 0.9  0.1 - 1.0 K/uL   Eosinophils  Relative 1  0 - 5 %   Eosinophils Absolute 0.1  0.0 - 0.7 K/uL   Basophils Relative 0  0 - 1 %   Basophils Absolute 0.0  0.0 - 0.1 K/uL  COMPREHENSIVE METABOLIC PANEL     Status: Abnormal   Collection Time    02/17/13  3:41 PM      Result Value Range   Sodium 135  135 - 145 mEq/L   Potassium 3.9  3.5 - 5.1 mEq/L   Chloride 103  96 - 112 mEq/L   CO2 24  19 - 32 mEq/L   Glucose, Bld 68 (*) 70 - 99 mg/dL   BUN 6  6 - 23 mg/dL   Creatinine, Ser 7.82  0.50 - 1.10 mg/dL   Calcium 9.0  8.4 - 95.6 mg/dL   Total Protein 6.0  6.0 - 8.3 g/dL   Albumin 2.5 (*) 3.5 - 5.2 g/dL   AST 15  0 - 37 U/L   ALT 12  0 - 35 U/L   Alkaline Phosphatase 97  39 - 117 U/L   Total Bilirubin 0.2 (*) 0.3 - 1.2 mg/dL   GFR calc non Af Amer >90  >90 mL/min   GFR calc Af Amer >90  >90 mL/min    1635: D/W Dr. Renaldo Fiddler: patient is ok for dc home.   Assessment and Plan  Chest pain in pregnancy  Rest and hydrate well over the weekend Danger signs reviewed Return to MAU as needed FU with the office as planned.   Tawnya Crook 02/17/2013, 4:33 PM

## 2013-02-17 NOTE — MAU Note (Signed)
Pt reports she has been having episodes of SOB for several weeks . Today much worse. C/o chest pressure like she "can't get her air down". Sent from office for further evaluation.

## 2013-03-03 ENCOUNTER — Other Ambulatory Visit (HOSPITAL_COMMUNITY): Payer: Self-pay | Admitting: Obstetrics and Gynecology

## 2013-03-03 DIAGNOSIS — O09529 Supervision of elderly multigravida, unspecified trimester: Secondary | ICD-10-CM

## 2013-03-03 DIAGNOSIS — O283 Abnormal ultrasonic finding on antenatal screening of mother: Secondary | ICD-10-CM

## 2013-03-09 ENCOUNTER — Ambulatory Visit (HOSPITAL_COMMUNITY)
Admission: RE | Admit: 2013-03-09 | Discharge: 2013-03-09 | Disposition: A | Discharge status: Home / Self Care | Payer: Federal, State, Local not specified - PPO | Source: Ambulatory Visit | Attending: Obstetrics and Gynecology | Admitting: Obstetrics and Gynecology

## 2013-03-09 ENCOUNTER — Encounter (HOSPITAL_COMMUNITY): Payer: Self-pay

## 2013-03-09 DIAGNOSIS — O09529 Supervision of elderly multigravida, unspecified trimester: Secondary | ICD-10-CM | POA: Insufficient documentation

## 2013-03-09 DIAGNOSIS — O358XX0 Maternal care for other (suspected) fetal abnormality and damage, not applicable or unspecified: Secondary | ICD-10-CM | POA: Insufficient documentation

## 2013-03-09 DIAGNOSIS — O283 Abnormal ultrasonic finding on antenatal screening of mother: Secondary | ICD-10-CM

## 2013-03-09 DIAGNOSIS — O3440 Maternal care for other abnormalities of cervix, unspecified trimester: Secondary | ICD-10-CM | POA: Insufficient documentation

## 2013-04-13 ENCOUNTER — Encounter (HOSPITAL_COMMUNITY): Payer: Self-pay | Admitting: *Deleted

## 2013-04-13 ENCOUNTER — Encounter (HOSPITAL_COMMUNITY): Payer: Federal, State, Local not specified - PPO | Admitting: Anesthesiology

## 2013-04-13 ENCOUNTER — Inpatient Hospital Stay (HOSPITAL_COMMUNITY): Payer: Federal, State, Local not specified - PPO | Admitting: Anesthesiology

## 2013-04-13 ENCOUNTER — Inpatient Hospital Stay (HOSPITAL_COMMUNITY)
Admission: AD | Admit: 2013-04-13 | Discharge: 2013-04-15 | DRG: 775 | Disposition: A | Discharge status: Home / Self Care | Payer: Federal, State, Local not specified - PPO | Source: Ambulatory Visit | Attending: Obstetrics and Gynecology | Admitting: Obstetrics and Gynecology

## 2013-04-13 DIAGNOSIS — IMO0001 Reserved for inherently not codable concepts without codable children: Secondary | ICD-10-CM

## 2013-04-13 DIAGNOSIS — O99844 Bariatric surgery status complicating childbirth: Secondary | ICD-10-CM | POA: Diagnosis present

## 2013-04-13 DIAGNOSIS — O358XX0 Maternal care for other (suspected) fetal abnormality and damage, not applicable or unspecified: Secondary | ICD-10-CM | POA: Diagnosis present

## 2013-04-13 DIAGNOSIS — O4290 Premature rupture of membranes, unspecified as to length of time between rupture and onset of labor, unspecified weeks of gestation: Principal | ICD-10-CM | POA: Diagnosis present

## 2013-04-13 DIAGNOSIS — O09529 Supervision of elderly multigravida, unspecified trimester: Secondary | ICD-10-CM | POA: Diagnosis present

## 2013-04-13 LAB — CBC
HCT: 31.4 % — ABNORMAL LOW (ref 36.0–46.0)
MCH: 27.8 pg (ref 26.0–34.0)
MCV: 86.5 fL (ref 78.0–100.0)
RBC: 3.63 MIL/uL — ABNORMAL LOW (ref 3.87–5.11)
RDW: 13.9 % (ref 11.5–15.5)
WBC: 13.7 10*3/uL — ABNORMAL HIGH (ref 4.0–10.5)

## 2013-04-13 LAB — POCT FERN TEST: POCT Fern Test: POSITIVE

## 2013-04-13 MED ORDER — LACTATED RINGERS IV SOLN: 500.0000 mL | Freq: Once | INTRAVENOUS | Status: DC

## 2013-04-13 MED ORDER — OXYTOCIN BOLUS FROM INFUSION: 500.0000 mL | INTRAVENOUS | Status: DC

## 2013-04-13 MED ORDER — OXYCODONE-ACETAMINOPHEN 5-325 MG PO TABS: 1.0000 | ORAL_TABLET | ORAL | Status: DC | PRN

## 2013-04-13 MED ORDER — SODIUM CHLORIDE 0.9 % IV SOLN
2.0000 g | Freq: Four times a day (QID) | INTRAVENOUS | Status: DC
  Administered 2013-04-13: 2 g via INTRAVENOUS
  Filled 2013-04-13 (×4): qty 2000

## 2013-04-13 MED ORDER — PHENYLEPHRINE 40 MCG/ML (10ML) SYRINGE FOR IV PUSH (FOR BLOOD PRESSURE SUPPORT)
80.0000 ug | PREFILLED_SYRINGE | INTRAVENOUS | Status: DC | PRN
  Filled 2013-04-13: qty 2

## 2013-04-13 MED ORDER — LIDOCAINE HCL (PF) 1 % IJ SOLN
INTRAMUSCULAR | Status: DC | PRN
  Administered 2013-04-13 (×2): 9 mL

## 2013-04-13 MED ORDER — OXYTOCIN 40 UNITS IN LACTATED RINGERS INFUSION - SIMPLE MED
1.0000 m[IU]/min | INTRAVENOUS | Status: DC
  Administered 2013-04-13: 2 m[IU]/min via INTRAVENOUS
  Filled 2013-04-13: qty 1000

## 2013-04-13 MED ORDER — BUTORPHANOL TARTRATE 1 MG/ML IJ SOLN: 1.0000 mg | INTRAMUSCULAR | Status: DC | PRN

## 2013-04-13 MED ORDER — DIPHENHYDRAMINE HCL 50 MG/ML IJ SOLN: 12.5000 mg | INTRAMUSCULAR | Status: DC | PRN

## 2013-04-13 MED ORDER — EPHEDRINE 5 MG/ML INJ
10.0000 mg | INTRAVENOUS | Status: DC | PRN
  Filled 2013-04-13: qty 2
  Filled 2013-04-13 (×2): qty 4

## 2013-04-13 MED ORDER — EPHEDRINE 5 MG/ML INJ
10.0000 mg | INTRAVENOUS | Status: DC | PRN
  Administered 2013-04-13: 10 mg via INTRAVENOUS
  Filled 2013-04-13: qty 2

## 2013-04-13 MED ORDER — FENTANYL 2.5 MCG/ML BUPIVACAINE 1/10 % EPIDURAL INFUSION (WH - ANES)
INTRAMUSCULAR | Status: DC | PRN
  Administered 2013-04-13: 14 mL/h via EPIDURAL

## 2013-04-13 MED ORDER — TERBUTALINE SULFATE 1 MG/ML IJ SOLN: 0.2500 mg | Freq: Once | INTRAMUSCULAR | Status: AC | PRN

## 2013-04-13 MED ORDER — OXYTOCIN 40 UNITS IN LACTATED RINGERS INFUSION - SIMPLE MED: 62.5000 mL/h | INTRAVENOUS | Status: DC

## 2013-04-13 MED ORDER — ACETAMINOPHEN 325 MG PO TABS: 650.0000 mg | ORAL_TABLET | ORAL | Status: DC | PRN

## 2013-04-13 MED ORDER — LACTATED RINGERS IV SOLN: 500.0000 mL | INTRAVENOUS | Status: DC | PRN

## 2013-04-13 MED ORDER — LIDOCAINE HCL (PF) 1 % IJ SOLN
30.0000 mL | INTRAMUSCULAR | Status: DC | PRN
  Filled 2013-04-13 (×2): qty 30

## 2013-04-13 MED ORDER — PROMETHAZINE HCL 25 MG/ML IJ SOLN: 25.0000 mg | Freq: Four times a day (QID) | INTRAMUSCULAR | Status: DC | PRN

## 2013-04-13 MED ORDER — ONDANSETRON HCL 4 MG/2ML IJ SOLN: 4.0000 mg | Freq: Four times a day (QID) | INTRAMUSCULAR | Status: DC | PRN

## 2013-04-13 MED ORDER — FENTANYL 2.5 MCG/ML BUPIVACAINE 1/10 % EPIDURAL INFUSION (WH - ANES)
14.0000 mL/h | INTRAMUSCULAR | Status: DC | PRN
  Filled 2013-04-13: qty 125

## 2013-04-13 MED ORDER — FLEET ENEMA 7-19 GM/118ML RE ENEM: 1.0000 | ENEMA | RECTAL | Status: DC | PRN

## 2013-04-13 MED ORDER — PHENYLEPHRINE 40 MCG/ML (10ML) SYRINGE FOR IV PUSH (FOR BLOOD PRESSURE SUPPORT)
80.0000 ug | PREFILLED_SYRINGE | INTRAVENOUS | Status: DC | PRN
  Filled 2013-04-13: qty 10
  Filled 2013-04-13: qty 2

## 2013-04-13 MED ORDER — IBUPROFEN 600 MG PO TABS
600.0000 mg | ORAL_TABLET | Freq: Four times a day (QID) | ORAL | Status: DC | PRN
  Administered 2013-04-14: 600 mg via ORAL
  Filled 2013-04-13: qty 1

## 2013-04-13 MED ORDER — CITRIC ACID-SODIUM CITRATE 334-500 MG/5ML PO SOLN: 30.0000 mL | ORAL | Status: DC | PRN

## 2013-04-13 MED ORDER — LACTATED RINGERS IV SOLN
INTRAVENOUS | Status: DC
  Administered 2013-04-13: 22:00:00 via INTRAVENOUS

## 2013-04-13 NOTE — Anesthesia Preprocedure Evaluation (Signed)
Anesthesia Evaluation  Patient identified by MRN, date of birth, ID band Patient awake    Reviewed: Allergy & Precautions, H&P , NPO status , Patient's Chart, lab work & pertinent test results  Airway Mallampati: II TM Distance: >3 FB Neck ROM: full    Dental no notable dental hx.    Pulmonary neg pulmonary ROS,    Pulmonary exam normal       Cardiovascular negative cardio ROS      Neuro/Psych negative neurological ROS  negative psych ROS   GI/Hepatic negative GI ROS, Neg liver ROS,   Endo/Other  negative endocrine ROS  Renal/GU      Musculoskeletal negative musculoskeletal ROS (+)   Abdominal Normal abdominal exam  (+)   Peds  Hematology negative hematology ROS (+)   Anesthesia Other Findings   Reproductive/Obstetrics (+) Pregnancy                           Anesthesia Physical Anesthesia Plan  ASA: II  Anesthesia Plan: Epidural   Post-op Pain Management:    Induction:   Airway Management Planned:   Additional Equipment:   Intra-op Plan:   Post-operative Plan:   Informed Consent: I have reviewed the patients History and Physical, chart, labs and discussed the procedure including the risks, benefits and alternatives for the proposed anesthesia with the patient or authorized representative who has indicated his/her understanding and acceptance.     Plan Discussed with:   Anesthesia Plan Comments:         Anesthesia Quick Evaluation

## 2013-04-13 NOTE — MAU Provider Note (Signed)
HPI:  Ms. EMILLEE TALSMA is a 37 y.o. female [phone] at [redacted]w[redacted]d who presents for a labor evaluation along with complaints of leaking clear fluid since 0830. Pt has continued to leak fluid throughout the day; enough to wear a pad. GBS negative per patient.    Objective:  GENERAL: Well-developed, well-nourished female in no acute distress.  HEENT: Normocephalic, atraumatic.   LUNGS: Effort normal SKIN: Warm, dry and without erythema PSYCH: Normal mood and affect  Filed Vitals:   04/13/13 1636  BP: 108/69  Pulse: 73  Temp: 98 F (36.7 C)  TempSrc: Oral  Resp: 18    Speculum exam: Vagina - Large amount of clear/ blood tinge fluid pooling in the vagina, no odor Bimanual exam: Dilation: Fingertip Effacement (%): 50 Cervical Position: Posterior Station: -3 Presentation: Vertex Exam by:: J. Arienne Gartin NP Chaperone present for exam.   A: Pt's membranes are grossly ruptured. RN to call MD with results for admission to labor and delivery   Iona Hansen Katisha Shimizu, NP 04/13/2013 5:12 PM

## 2013-04-13 NOTE — Progress Notes (Signed)
Dr Rana Snare updated on pt status.  Start abx for .12 hour SROM then start pitocin protocol at 2000.

## 2013-04-13 NOTE — Progress Notes (Signed)
EFM removed for epidural insertion. 

## 2013-04-13 NOTE — Progress Notes (Signed)
Pt instructed she may begin pushing with ctxs. 

## 2013-04-13 NOTE — Anesthesia Procedure Notes (Signed)
Epidural Patient location during procedure: OB Start time: 04/13/2013 9:47 PM End time: 04/13/2013 9:51 PM  Staffing Anesthesiologist: Leilani Able Performed by: anesthesiologist   Preanesthetic Checklist Completed: patient identified, surgical consent, pre-op evaluation, timeout performed, IV checked, risks and benefits discussed and monitors and equipment checked  Epidural Patient position: sitting Prep: site prepped and draped and DuraPrep Patient monitoring: continuous pulse ox and blood pressure Approach: midline Injection technique: LOR air  Needle:  Needle type: Tuohy  Needle gauge: 17 G Needle length: 9 cm and 9 Needle insertion depth: 6 cm Catheter type: closed end flexible Catheter size: 19 Gauge Catheter at skin depth: 11 cm Test dose: negative and Other  Assessment Sensory level: T9 Events: blood not aspirated, injection not painful, no injection resistance, negative IV test and no paresthesia  Additional Notes Reason for block:procedure for pain

## 2013-04-13 NOTE — Progress Notes (Signed)
Dr. Rana Snare notified regarding pt's request for IV pain meds, new orders received.

## 2013-04-13 NOTE — H&P (Signed)
[name] Curry is a 37 y.o. female presenting for SROM at 0800 but present 10  Hours later to triage now with ctxs.  Pregnancy complicated by AMA with normal Maternit 21.  Fetal intraabdominal cysts (2) in LUQ of fetus.  Stable and good fetal growth.  Has been followed by [facility] -  MFM for these and consultation with [facility].  Plan is for routine delivery and reassess post delivery.  GBS - History OB History   Grav Para Term Preterm Abortions TAB SAB Ect Mult Living   4 1 1  2 1 1   1      Past Medical History  Diagnosis Date  . PONV (postoperative nausea and vomiting)     w general anesthesia  . Termination of pregnancy   . SVD (spontaneous vaginal delivery) 1999    x 1  . Anemia   . Kidney stones, calcium oxalate     passed stone  - no surgery required   Past Surgical History  Procedure Laterality Date  . Gastric bypass surgery  2003  . Ganglion cyst removed left wrist  1997  . Wisdom tooth extraction  2001  . Cholecystectomy      w gastrc bypass surgery  . Cryotherapy    . Dilation and curettage of uterus      w termination  . Dilation and evacuation  02/28/2012    Procedure: DILATATION AND EVACUATION;  Surgeon: [name], MD;  Location: [facility];  Service: Gynecology;  Laterality: N/A;   Family History: family history is not on file. Social History:  reports that she has never smoked. She has never used smokeless tobacco. She reports that she does not drink alcohol or use illicit drugs.   Prenatal Transfer Tool  Maternal Diabetes: No Genetic Screening: Normal Maternal Ultrasounds/Referrals: Abnormal:  Findings:   Other:LUQ fetal intra abdominal cysts (see MFM report) Fetal Ultrasounds or other Referrals:  Referred to Materal Fetal Medicine  Maternal Substance Abuse:  No Significant Maternal Medications:  None Significant Maternal Lab Results:  None Other Comments:  None  ROS  Dilation: 1 Effacement (%): 80 Station: -2 Exam by:: F. Morris, RNC Blood  pressure 104/73, pulse 72, temperature 98.2 F (36.8 C), temperature source Oral, resp. rate 18, height 5\' 5"  (1.651 m), weight 86.183 kg (190 lb), last menstrual period 07/06/2012. Exam Physical Exam  Prenatal labs: ABO, Rh: A/Positive/-- (03/20 0000) Antibody:   Rubella: Immune (03/20 0000) RPR: Nonreactive (03/20 0000)  HBsAg: Negative (03/20 0000)  HIV: Non-reactive (03/20 0000)  GBS: Negative (11/25 0000)   Assessment/Plan: IUP at term with PROM/SROM Begin ampicillin 2 grams q 6 h since PROM and remote from delivery and will augment with pitocin Anticipate SVD Peds to evaluate fetal abd cysts after delivery   Meade Hogeland C 04/13/2013, 8:23 PM

## 2013-04-13 NOTE — MAU Note (Signed)
Water broke at 0800, clear fluid still coming. No bleeding.  Ctx's kicked in about , now every 5-6 min.  Has not been dilated.

## 2013-04-14 ENCOUNTER — Encounter (HOSPITAL_COMMUNITY): Payer: Self-pay | Admitting: *Deleted

## 2013-04-14 LAB — CBC
MCH: 28 pg (ref 26.0–34.0)
MCV: 85.7 fL (ref 78.0–100.0)
Platelets: 206 10*3/uL (ref 150–400)
RDW: 13.8 % (ref 11.5–15.5)

## 2013-04-14 MED ORDER — AZITHROMYCIN 250 MG PO TABS
250.0000 mg | ORAL_TABLET | Freq: Every day | ORAL | Status: DC
  Administered 2013-04-14 – 2013-04-15 (×2): 250 mg via ORAL
  Filled 2013-04-14 (×3): qty 1

## 2013-04-14 MED ORDER — OSELTAMIVIR PHOSPHATE 75 MG PO CAPS
75.0000 mg | ORAL_CAPSULE | Freq: Every day | ORAL | Status: DC
  Administered 2013-04-14 – 2013-04-15 (×2): 75 mg via ORAL
  Filled 2013-04-14 (×3): qty 1

## 2013-04-14 MED ORDER — IBUPROFEN 600 MG PO TABS
600.0000 mg | ORAL_TABLET | Freq: Four times a day (QID) | ORAL | Status: DC
  Administered 2013-04-14 – 2013-04-15 (×7): 600 mg via ORAL
  Filled 2013-04-14 (×8): qty 1

## 2013-04-14 MED ORDER — WITCH HAZEL-GLYCERIN EX PADS: 1.0000 "application " | MEDICATED_PAD | CUTANEOUS | Status: DC | PRN

## 2013-04-14 MED ORDER — MEASLES, MUMPS & RUBELLA VAC ~~LOC~~ INJ
0.5000 mL | INJECTION | Freq: Once | SUBCUTANEOUS | Status: DC
  Filled 2013-04-14: qty 0.5

## 2013-04-14 MED ORDER — SIMETHICONE 80 MG PO CHEW: 80.0000 mg | CHEWABLE_TABLET | ORAL | Status: DC | PRN

## 2013-04-14 MED ORDER — BENZOCAINE-MENTHOL 20-0.5 % EX AERO
1.0000 "application " | INHALATION_SPRAY | CUTANEOUS | Status: DC | PRN
  Administered 2013-04-14: 1 via TOPICAL
  Filled 2013-04-14: qty 56

## 2013-04-14 MED ORDER — PRENATAL MULTIVITAMIN CH
1.0000 | ORAL_TABLET | Freq: Every day | ORAL | Status: DC
  Administered 2013-04-14 – 2013-04-15 (×2): 1 via ORAL
  Filled 2013-04-14 (×2): qty 1

## 2013-04-14 MED ORDER — ZOLPIDEM TARTRATE 5 MG PO TABS: 5.0000 mg | ORAL_TABLET | Freq: Every evening | ORAL | Status: DC | PRN

## 2013-04-14 MED ORDER — SENNOSIDES-DOCUSATE SODIUM 8.6-50 MG PO TABS
2.0000 | ORAL_TABLET | ORAL | Status: DC
  Administered 2013-04-14: 2 via ORAL
  Filled 2013-04-14: qty 2

## 2013-04-14 MED ORDER — MEDROXYPROGESTERONE ACETATE 150 MG/ML IM SUSP: 150.0000 mg | INTRAMUSCULAR | Status: DC | PRN

## 2013-04-14 MED ORDER — OXYCODONE-ACETAMINOPHEN 5-325 MG PO TABS
1.0000 | ORAL_TABLET | ORAL | Status: DC | PRN
  Administered 2013-04-14 (×3): 1 via ORAL
  Administered 2013-04-14: 2 via ORAL
  Administered 2013-04-15 (×3): 1 via ORAL
  Filled 2013-04-14 (×2): qty 1
  Filled 2013-04-14: qty 2
  Filled 2013-04-14: qty 1
  Filled 2013-04-14: qty 2
  Filled 2013-04-14 (×2): qty 1

## 2013-04-14 MED ORDER — LANOLIN HYDROUS EX OINT: TOPICAL_OINTMENT | CUTANEOUS | Status: DC | PRN

## 2013-04-14 MED ORDER — DIPHENHYDRAMINE HCL 25 MG PO CAPS: 25.0000 mg | ORAL_CAPSULE | Freq: Four times a day (QID) | ORAL | Status: DC | PRN

## 2013-04-14 MED ORDER — TETANUS-DIPHTH-ACELL PERTUSSIS 5-2.5-18.5 LF-MCG/0.5 IM SUSP: 0.5000 mL | Freq: Once | INTRAMUSCULAR | Status: DC

## 2013-04-14 MED ORDER — ONDANSETRON HCL 4 MG/2ML IJ SOLN: 4.0000 mg | INTRAMUSCULAR | Status: DC | PRN

## 2013-04-14 MED ORDER — DIBUCAINE 1 % RE OINT: 1.0000 "application " | TOPICAL_OINTMENT | RECTAL | Status: DC | PRN

## 2013-04-14 MED ORDER — ONDANSETRON HCL 4 MG PO TABS
4.0000 mg | ORAL_TABLET | ORAL | Status: DC | PRN
  Administered 2013-04-14 – 2013-04-15 (×5): 4 mg via ORAL
  Filled 2013-04-14 (×5): qty 1

## 2013-04-14 NOTE — Anesthesia Postprocedure Evaluation (Signed)
Anesthesia Post Note  Patient: [name] Curry  Procedure(s) Performed: * No procedures listed *  Anesthesia type: Epidural  Patient location: Mother/Baby  Post pain: Pain level controlled  Post assessment: Post-op Vital signs reviewed  Last Vitals:  Filed Vitals:   04/14/13 0323  BP: 122/61  Pulse: 101  Temp: 37.1 C  Resp: 20    Post vital signs: Reviewed  Level of consciousness: awake  Complications: No apparent anesthesia complications

## 2013-04-14 NOTE — Progress Notes (Signed)
Post Partum Day 0 Subjective: no complaints, up ad lib, voiding and tolerating PO  Objective: Blood pressure 99/63, pulse 82, temperature 98.4 F (36.9 C), temperature source Oral, resp. rate 20, height 5\' 5"  (1.651 m), weight 86.183 kg (190 lb), last menstrual period 07/06/2012, SpO2 99.00%, unknown if currently breastfeeding.  Physical Exam:  General: alert, cooperative, appears stated age and no distress Lochia: appropriate Uterine Fundus: firm Incision: healing well DVT Evaluation: No evidence of DVT seen on physical exam.   Recent Labs  04/13/13 1856 04/14/13 0550  HGB 10.1* 9.2*  HCT 31.4* 28.1*    Assessment/Plan: Plan for discharge tomorrow, Breastfeeding and Circumcision prior to discharge I was called to evaluate 2 clots the patient had passed last night.  Currently not with heavy bleeding and uterus firm.  Will monitor   LOS: 1 day   Ruby Logiudice C 04/14/2013, 8:47 AM

## 2013-04-14 NOTE — Progress Notes (Signed)
Dr. Rana Snare in attendance for delivery.

## 2013-04-15 LAB — CBC
HCT: 25.8 % — ABNORMAL LOW (ref 36.0–46.0)
MCH: 27.9 pg (ref 26.0–34.0)
MCHC: 32.6 g/dL (ref 30.0–36.0)
RDW: 14 % (ref 11.5–15.5)
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
## 2013-04-15 NOTE — Lactation Note (Signed)
This note was copied from the chart of [name] Curry. Lactation Consultation Note  Patient Name: [name] Gretta Samons [id number] Date: 04/15/2013 Reason for consult: Follow-up assessment Mom reports sore left nipple, but otherwise no other breastfeeding issues. Mom given comfort gels. Enc to call with any concerns. Enc mom to attend BFSG.  Maternal Data    Feeding    LATCH Score/Interventions                      Lactation Tools Discussed/Used Tools: Comfort gels   Consult Status Consult Status: PRN    Geralynn Ochs 04/15/2013, 3:20 PM

## 2013-04-15 NOTE — Progress Notes (Signed)
PPD #1 Bleeding better Fatigued but ambulating without difficulty  VSS Afeb FFNT  Results for orders placed during the hospital encounter of 04/13/13 (from the past 24 hour(s))  CBC     Status: Abnormal   Collection Time    04/15/13  6:08 AM      Result Value Range   WBC 15.7 (*) 4.0 - 10.5 K/uL   RBC 3.01 (*) 3.87 - 5.11 MIL/uL   Hemoglobin 8.4 (*) 12.0 - 15.0 g/dL   HCT 16.1 (*) 09.6 - 04.5 %   MCV 85.7  78.0 - 100.0 fL   MCH 27.9  26.0 - 34.0 pg   MCHC 32.6  30.0 - 36.0 g/dL   RDW 40.9  81.1 - 91.4 %   Platelets 221  150 - 400 K/uL    A: Satisfactory   P: awaiting newborn U/S results     D/W patient discharge instructions     Percocet 5/325 #30, NR     Integra #60, 1 po bid NR

## 2013-04-15 NOTE — Discharge Summary (Signed)
Obstetric Discharge Summary Reason for Admission: onset of labor Prenatal Procedures: ultrasound Intrapartum Procedures: spontaneous vaginal delivery Postpartum Procedures: none Complications-Operative and Postpartum: none Hemoglobin  Date Value Range Status  04/15/2013 8.4* 12.0 - 15.0 g/dL Final     HCT  Date Value Range Status  04/15/2013 25.8* 36.0 - 46.0 % Final    Physical Exam:  General: alert, cooperative and no distress Lochia: appropriate Uterine Fundus: firm Incision: healing well DVT Evaluation: No evidence of DVT seen on physical exam.  Discharge Diagnoses: Term Pregnancy-delivered  Discharge Information: Date: 04/15/2013 Activity: pelvic rest Diet: routine Medications: PNV, Ibuprofen, Iron and Percocet Condition: stable Instructions: refer to practice specific booklet Discharge to: home   Newborn Data: Live born female  Birth Weight: 7 lb 9 oz (3430 g) APGAR: 9, 9  Home with mother.  [name] Curry,[name] Curry 04/15/2013, 3:17 PM

## 2013-07-12 ENCOUNTER — Encounter (HOSPITAL_COMMUNITY): Payer: Self-pay | Admitting: Emergency Medicine

## 2013-07-12 ENCOUNTER — Emergency Department (HOSPITAL_COMMUNITY): Payer: Federal, State, Local not specified - PPO

## 2013-07-12 ENCOUNTER — Emergency Department (HOSPITAL_COMMUNITY)
Admission: EM | Admit: 2013-07-12 | Discharge: 2013-07-12 | Disposition: A | Discharge status: Home / Self Care | Payer: Federal, State, Local not specified - PPO | Attending: Emergency Medicine | Admitting: Emergency Medicine

## 2013-07-12 DIAGNOSIS — R091 Pleurisy: Secondary | ICD-10-CM

## 2013-07-12 DIAGNOSIS — Z791 Long term (current) use of non-steroidal anti-inflammatories (NSAID): Secondary | ICD-10-CM | POA: Insufficient documentation

## 2013-07-12 DIAGNOSIS — Z3202 Encounter for pregnancy test, result negative: Secondary | ICD-10-CM | POA: Insufficient documentation

## 2013-07-12 DIAGNOSIS — F411 Generalized anxiety disorder: Secondary | ICD-10-CM | POA: Insufficient documentation

## 2013-07-12 DIAGNOSIS — Z87442 Personal history of urinary calculi: Secondary | ICD-10-CM | POA: Insufficient documentation

## 2013-07-12 DIAGNOSIS — D649 Anemia, unspecified: Secondary | ICD-10-CM | POA: Insufficient documentation

## 2013-07-12 DIAGNOSIS — Z86718 Personal history of other venous thrombosis and embolism: Secondary | ICD-10-CM | POA: Insufficient documentation

## 2013-07-12 DIAGNOSIS — R079 Chest pain, unspecified: Secondary | ICD-10-CM

## 2013-07-12 DIAGNOSIS — Z79899 Other long term (current) drug therapy: Secondary | ICD-10-CM | POA: Insufficient documentation

## 2013-07-12 LAB — COMPREHENSIVE METABOLIC PANEL
ALK PHOS: 104 U/L (ref 39–117)
ALT: 36 U/L — ABNORMAL HIGH (ref 0–35)
AST: 81 U/L — ABNORMAL HIGH (ref 0–37)
Albumin: 3.2 g/dL — ABNORMAL LOW (ref 3.5–5.2)
BILIRUBIN TOTAL: 0.4 mg/dL (ref 0.3–1.2)
BUN: 10 mg/dL (ref 6–23)
CHLORIDE: 102 meq/L (ref 96–112)
CO2: 19 mEq/L (ref 19–32)
Calcium: 9.2 mg/dL (ref 8.4–10.5)
Creatinine, Ser: 0.73 mg/dL (ref 0.50–1.10)
GFR calc non Af Amer: >90 mL/min (ref >90)
GLUCOSE: 87 mg/dL (ref 70–99)
POTASSIUM: 3.8 meq/L (ref 3.7–5.3)
Sodium: 139 mEq/L (ref 137–147)
Total Protein: 6.8 g/dL (ref 6.0–8.3)

## 2013-07-12 LAB — URINE MICROSCOPIC-ADD ON

## 2013-07-12 LAB — CBC WITH DIFFERENTIAL/PLATELET
Basophils Absolute: 0 10*3/uL (ref 0.0–0.1)
Basophils Relative: 0 % (ref 0–1)
Eosinophils Absolute: 0.1 10*3/uL (ref 0.0–0.7)
Eosinophils Relative: 1 % (ref 0–5)
HEMATOCRIT: 39.3 % (ref 36.0–46.0)
Hemoglobin: 12.6 g/dL (ref 12.0–15.0)
LYMPHS ABS: 1.4 10*3/uL (ref 0.7–4.0)
Lymphocytes Relative: 15 % (ref 12–46)
MCH: 27.2 pg (ref 26.0–34.0)
MCHC: 32.1 g/dL (ref 30.0–36.0)
MCV: 84.7 fL (ref 78.0–100.0)
MONO ABS: 0.5 10*3/uL (ref 0.1–1.0)
MONOS PCT: 6 % (ref 3–12)
NEUTROS ABS: 7.1 10*3/uL (ref 1.7–7.7)
NEUTROS PCT: 78 % — AB (ref 43–77)
Platelets: 212 10*3/uL (ref 150–400)
RBC: 4.64 MIL/uL (ref 3.87–5.11)
RDW: 14.6 % (ref 11.5–15.5)
WBC: 9.1 10*3/uL (ref 4.0–10.5)

## 2013-07-12 LAB — URINALYSIS, ROUTINE W REFLEX MICROSCOPIC
Glucose, UA: NEGATIVE mg/dL
Hgb urine dipstick: NEGATIVE
Ketones, ur: 15 mg/dL — AB
Nitrite: NEGATIVE
PROTEIN: NEGATIVE mg/dL
Specific Gravity, Urine: 1.028 (ref 1.005–1.030)
UROBILINOGEN UA: 0.2 mg/dL (ref 0.0–1.0)
pH: 5.5 (ref 5.0–8.0)

## 2013-07-12 LAB — I-STAT TROPONIN, ED: TROPONIN I, POC: 0 ng/mL (ref 0.00–0.08)

## 2013-07-12 LAB — D-DIMER, QUANTITATIVE: D-Dimer, Quant: 0.34 ug/mL-FEU (ref 0.00–0.48)

## 2013-07-12 LAB — POC URINE PREG, ED: Preg Test, Ur: NEGATIVE

## 2013-07-12 MED ORDER — NAPROXEN 500 MG PO TABS: 500.0000 mg | ORAL_TABLET | Freq: Two times a day (BID) | ORAL | Status: DC

## 2013-07-12 MED ORDER — FENTANYL CITRATE 0.05 MG/ML IJ SOLN
50.0000 ug | Freq: Once | INTRAMUSCULAR | Status: AC
  Administered 2013-07-12: 50 ug via INTRAVENOUS
  Filled 2013-07-12: qty 2

## 2013-07-12 MED ORDER — ONDANSETRON HCL 4 MG/2ML IJ SOLN
4.0000 mg | Freq: Once | INTRAMUSCULAR | Status: AC
  Administered 2013-07-12: 4 mg via INTRAVENOUS
  Filled 2013-07-12: qty 2

## 2013-07-12 MED ORDER — KETOROLAC TROMETHAMINE 30 MG/ML IJ SOLN
30.0000 mg | Freq: Once | INTRAMUSCULAR | Status: AC
  Administered 2013-07-12: 30 mg via INTRAVENOUS
  Filled 2013-07-12: qty 1

## 2013-07-12 NOTE — ED Notes (Signed)
Per EMS: pt reports left lower chest pain radiating to right lower chest. Pain comes and goes, pt reports n/v prior to EMS arrival. EKG unremarkable. Pt denies shortness. Increase pain with inspirations. Pt is A&Ox4, respirations equal and unlabored, skin warm and dry.

## 2013-07-12 NOTE — Discharge Instructions (Signed)
Chest Pain (Nonspecific) Chest pain has many causes. Your pain could be caused by something serious, such as a heart attack or a blood clot in the lungs. It could also be caused by something less serious, such as a chest bruise or a virus. Follow up with your doctor. More lab tests or other studies may be needed to find the cause of your pain. Most of the time, nonspecific chest pain will improve within 2 to 3 days of rest and mild pain medicine. HOME CARE  For chest bruises, you may put ice on the sore area for 15-20 minutes, 03-04 times a day. Do this only if it makes you feel better.  Put ice in a plastic bag.  Place a towel between the skin and the bag.  Rest for the next 2 to 3 days.  Go back to work if the pain improves.  See your doctor if the pain lasts longer than 1 to 2 weeks.  Only take medicine as told by your doctor.  Quit smoking if you smoke. GET HELP RIGHT AWAY IF:   There is more pain or pain that spreads to the arm, neck, jaw, back, or belly (abdomen).  You have shortness of breath.  You cough more than usual or cough up blood.  You have very bad back or belly pain, feel sick to your stomach (nauseous), or throw up (vomit).  You have very bad weakness.  You pass out (faint).  You have a fever. Any of these problems may be serious and may be an emergency. Do not wait to see if the problems will go away. Get medical help right away. Call your local emergency services 911 in [place] Do not drive yourself to the hospital. MAKE SURE YOU:   Understand these instructions.  Will watch this condition.  Will get help right away if you or your child is not doing well or gets worse. Document Released: 09/24/2007 Document Revised: 06/30/2011 Document Reviewed: 09/24/2007 [facility] Hospital[facility] Patient Information 2014 [place], [place].  Pleurisy Pleurisy is an inflammation and swelling of the lining of the lungs (pleura). Because of this inflammation, it hurts to breathe. It can be  aggravated by coughing, laughing, or deep breathing. Pleurisy is often caused by an underlying infection or disease.  HOME CARE INSTRUCTIONS  Monitor your pleurisy for any changes. The following actions may help to alleviate any discomfort you are experiencing:  Medicine may help with pain. Only take over-the-counter or prescription medicines for pain, discomfort, or fever as directed by your health care provider.  Only take antibiotic medicine as directed. Make sure to finish it even if you start to feel better. SEEK MEDICAL CARE IF:   Your pain is not controlled with medicine or is increasing.  You have an increase in pus-like (purulent) secretions brought up with coughing. SEEK IMMEDIATE MEDICAL CARE IF:   You have blue or dark lips, fingernails, or toenails.  You are coughing up blood.  You have increased difficulty breathing.  You have continuing pain unrelieved by medicine or pain lasting more than 1 week.  You have pain that radiates into your neck, arms, or jaw.  You develop increased shortness of breath or wheezing.  You develop a fever, rash, vomiting, fainting, or other serious symptoms. MAKE SURE YOU:  Understand these instructions.   Will watch your condition.   Will get help right away if you are not doing well or get worse.  Document Released: 04/07/2005 Document Revised: 12/08/2012 Document Reviewed: 09/19/2012 ExitCare Patient  Information ©2014 [employer]. ° °

## 2013-07-12 NOTE — ED Provider Notes (Signed)
CSN: [id number]     Arrival date & time 07/12/13  [id number] History   First MD Initiated Contact with Patient 07/12/13 [id number]     Chief Complaint  Patient presents with  . Chest Pain      HPI  Patient presents with chest pain. She was asleep this morning. She waking with bilateral lower anterior chest pain. She states it is sharp. She admits that she became somewhat anxious and felt short of breath. His history closed awakened with pain first. She is nonanxious now. She states initially her pain was a 10. It is improved markedly since now is about 4. She has not had similar episodes of pain before. Does not feel short of breath now "after I calmed down". Was not having any symptoms prior to going to bed. Has not had URI. Has not had cough, fever, or shortness of breath. She is nursing. She delivered in December. Did not have complications of pregnancy. No swelling of her extremities. She did start progesterone 4 weeks ago. 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  Past Medical History  Diagnosis Date  . PONV (postoperative nausea and vomiting)     w general anesthesia  . Termination of pregnancy   . SVD (spontaneous vaginal delivery) 1999    x 1  . Anemia   . Kidney stones, calcium oxalate     passed stone  - no surgery required   Past Surgical History  Procedure Laterality Date  . Gastric bypass surgery  2003  . Ganglion cyst removed left wrist  1997  . Wisdom tooth extraction  2001  . Cholecystectomy      w gastrc bypass surgery  . Cryotherapy    . Dilation and curettage of uterus      w termination  . Dilation and evacuation  02/28/2012    Procedure: DILATATION AND EVACUATION;  Surgeon: Meriel Picaichard M Holland, MD;  Location: [facility];  Service: Gynecology;  Laterality: N/A;   History reviewed. No pertinent family history. History  Substance Use Topics  . Smoking status: Never Smoker   . Smokeless tobacco: Never Used  . Alcohol Use: No     Comment:  socially   OB History   Grav Para Term Preterm Abortions TAB SAB Ect Mult Living   4 2 2  2 1 1   2      Review of Systems  Constitutional: Negative for fever, chills, diaphoresis, appetite change and fatigue.  HENT: Negative for mouth sores, sore throat and trouble swallowing.   Eyes: Negative for visual disturbance.  Respiratory: Positive for shortness of breath. Negative for cough, chest tightness and wheezing.   Cardiovascular: Positive for chest pain.  Gastrointestinal: Negative for nausea, vomiting, abdominal pain, diarrhea and abdominal distention.  Endocrine: Negative for polydipsia, polyphagia and polyuria.  Genitourinary: Negative for dysuria, frequency and hematuria.  Musculoskeletal: Negative for gait problem.  Skin: Negative for color change, pallor and rash.  Neurological: Negative for dizziness, syncope, light-headedness and headaches.  Hematological: Does not bruise/bleed easily.  Psychiatric/Behavioral: Negative for behavioral problems and confusion.      Allergies  Doxycycline  Home Medications   Current Outpatient Rx  Name  Route  Sig  Dispense  Refill  . ferrous sulfate 325 (65 FE) MG tablet   Oral   Take 325 mg by mouth daily with breakfast.         . norethindrone (MICRONOR,CAMILA,ERRIN) 0.35 MG tablet   Oral   Take 1 tablet  by mouth daily.          . naproxen (NAPROSYN) 500 MG tablet   Oral   Take 1 tablet (500 mg total) by mouth 2 (two) times daily.   30 tablet   0    BP 94/64  Pulse 74  Temp(Src) 98.3 F (36.8 C) (Oral)  Resp 23  Ht 5\' 5"  (1.651 m)  Wt 170 lb (77.111 kg)  BMI 28.29 kg/m2  SpO2 96% Physical Exam  Constitutional: She is oriented to person, place, and time. She appears well-developed and well-nourished. No distress.  She's awake alert. She does not seem anxious.  HENT:  Head: Normocephalic.  Eyes: Conjunctivae are normal. Pupils are equal, round, and reactive to light. No scleral icterus.  Neck: Normal range of  motion. Neck supple. No thyromegaly present.  Cardiovascular: Normal rate and regular rhythm.  Exam reveals no gallop and no friction rub.   No murmur heard. Pulmonary/Chest: Effort normal and breath sounds normal. No respiratory distress. She has no wheezes. She has no rales.  Clear lungs. No pleural or pericardial friction rubs. No abnormal or asymmetric breath sounds. No crackles or rales.  Abdominal: Soft. Bowel sounds are normal. She exhibits no distension. There is no tenderness. There is no rebound.  Musculoskeletal: Normal range of motion.  Neurological: She is alert and oriented to person, place, and time.  Skin: Skin is warm and dry. No rash noted.  Psychiatric: She has a normal mood and affect. Her behavior is normal.    ED Course  Procedures (including critical care time) Labs Review Labs Reviewed  CBC WITH DIFFERENTIAL - Abnormal; Notable for the following:    Neutrophils Relative % 78 (*)    All other components within normal limits  COMPREHENSIVE METABOLIC PANEL - Abnormal; Notable for the following:    Albumin 3.2 (*)    AST 81 (*)    ALT 36 (*)    All other components within normal limits  URINALYSIS, ROUTINE W REFLEX MICROSCOPIC - Abnormal; Notable for the following:    APPearance CLOUDY (*)    Bilirubin Urine SMALL (*)    Ketones, ur 15 (*)    Leukocytes, UA SMALL (*)    All other components within normal limits  URINE MICROSCOPIC-ADD ON - Abnormal; Notable for the following:    Squamous Epithelial / LPF FEW (*)    Bacteria, UA FEW (*)    All other components within normal limits  D-DIMER, QUANTITATIVE  I-STAT TROPOININ, ED  POC URINE PREG, ED   Imaging Review Dg Chest 2 View  07/12/2013   CLINICAL DATA:  Chest pain  EXAM: CHEST  2 VIEW  COMPARISON:  12/31/2009  FINDINGS: Cardiomediastinal silhouette is stable. No acute infiltrate or pleural effusion. No pulmonary edema. Bony thorax is unremarkable.  IMPRESSION: No active cardiopulmonary disease.    Electronically Signed   By: Natasha Mead M.D.   On: 07/12/2013 08:30     EKG Interpretation   Date/Time:  Tuesday July 12 2013 06:03:55 EDT Ventricular Rate:  86 PR Interval:  148 QRS Duration: 73 QT Interval:  346 QTC Calculation: 414 R Axis:   78 Text Interpretation:  Sinus rhythm Confirmed by Fayrene Fearing  MD, Caral Whan ([id number]) on  07/12/2013 6:55:35 AM      MDM   Final diagnoses:  Chest pain  Pleurisy    EKG shows no acute or severe changes. She's clear lungs is not hypoxemic. Resting heart rate 97. Edward d-dimer as a screen for PE she  does have history of DVT, recent pregnancy, and recent initiation of oral treatment. She is a nonsmoker. X-ray to rule out pneumonia and pneumothorax. Given Toradol.  08:49:  Get significant relief with IV Toradol only. Chest x-ray shows no pneumothorax or infiltrate. Normal d-dimer. Not tachycardic. Not hypoxemic. Normal exam. Plan will be discharge home. Treatment with anti-inflammatories for probable pleurisy.  Rolland Porter, MD 07/12/13 [phone]

## 2014-02-20 ENCOUNTER — Encounter (HOSPITAL_COMMUNITY): Payer: Self-pay | Admitting: Emergency Medicine

## 2014-05-22 ENCOUNTER — Other Ambulatory Visit: Payer: Self-pay | Admitting: Obstetrics and Gynecology

## 2014-05-23 LAB — CYTOLOGY - PAP

## 2015-09-07 NOTE — H&P (Signed)
[name] Curry  [id number]  DICTATION #  CSN# [id number]   Meriel PicaHOLLAND,[name], MD 09/07/2015 9:37 AM

## 2015-09-10 NOTE — H&P (Signed)
NAMAndi Curry:  Curry, [name]                ACCOUNT NO.:  [id number]  MEDICAL RECORD NO.:  [id number]  LOCATION:                                 FACILITY:  PHYSICIAN:  Duke Salviaichard M. Marcelle OverlieHolland, M.D.DATE OF BIRTH:  05-18-1975  DATE OF ADMISSION:  09/27/2015 DATE OF DISCHARGE:                             HISTORY & PHYSICAL   CHIEF COMPLAINT:  Nexplanon removal, tubal ligation.  HISTORY OF PRESENT ILLNESS:  A 40 year old, G4, P2 who had a Nexplanon placed on March 16 that she has not been extremely happy with due to some irregular bleeding.  The patient is found to have it removed and have laparoscopic tubal ligation.  The permanence of the tubal procedure, specific risks related to bleeding, infection, adjacent organ injury, possible need for additional surgery, and failure rated 2 to 06/998 all reviewed with her, which she understands and accepts.  ALLERGIES:  DOXYCYCLINE.  CURRENT MEDICATIONS:  Lunesta p.r.n., Klonopin p.r.n., Lexapro daily, and vitamins.  PRIOR SURGICAL HISTORY:  Gastric bypass 2003, 2 vaginal deliveries, and 1 prior D and C.  FAMILY HISTORY:  Significant for father with heart disease, kidney disease, diverticulosis, diabetes, hypertension.  Mother with breast cancer.  SOCIAL HISTORY:  Denies drug or tobacco use.  She drinks alcohol socially.  She is married.  PHYSICAL EXAMINATION:  VITAL SIGNS:  Temp 98.2, blood pressure 120/78. HEENT:  Unremarkable. NECK:  Supple without masses. LUNGS:  Clear. CARDIOVASCULAR:  Regular rate and rhythm without murmurs, rubs, gallops noted.  BREASTS:  Without masses. ABDOMEN:  Soft, flat, nontender. GU:  Vulva, vagina, cervix normal.  Uterus mid position, normal size. Adnexa negative. EXTREMITIES:  Unremarkable. NEUROLOGIC:  Unremarkable.  IMPRESSION:  Requests removal of Nexplanon and tubal ligation. Procedure and risks discussed as above.     [name] Curry M. Marcelle OverlieHolland, M.D.   ______________________________ Duke Salviaichard M. Marcelle OverlieHolland,  M.D.    [facility]/MEDQ  D:  09/07/2015  T:  09/08/2015  Job:  [id number]

## 2015-10-16 ENCOUNTER — Encounter (HOSPITAL_BASED_OUTPATIENT_CLINIC_OR_DEPARTMENT_OTHER): Payer: Self-pay | Admitting: *Deleted

## 2015-10-19 ENCOUNTER — Encounter (HOSPITAL_BASED_OUTPATIENT_CLINIC_OR_DEPARTMENT_OTHER): Payer: Self-pay | Admitting: *Deleted

## 2015-10-19 NOTE — Progress Notes (Signed)
NPO AFTER MN.  ARRIVE AT 0600.  NEEDS HG AND URINE PREG.  WILL TAKE  LEXAPRO AND PROTONIX AM DOS W/ SIPS OF WATER .

## 2015-10-25 ENCOUNTER — Encounter (HOSPITAL_BASED_OUTPATIENT_CLINIC_OR_DEPARTMENT_OTHER): Payer: Self-pay | Admitting: Anesthesiology

## 2015-10-25 NOTE — Anesthesia Preprocedure Evaluation (Addendum)
Anesthesia Evaluation  Patient identified by MRN, date of birth, ID band Patient awake    Reviewed: Allergy & Precautions, NPO status , Patient's Chart, lab work & pertinent test results  History of Anesthesia Complications (+) PONV and history of anesthetic complications  Airway Mallampati: II  TM Distance: >3 FB Neck ROM: Full    Dental no notable dental hx.    Pulmonary neg pulmonary ROS,    Pulmonary exam normal breath sounds clear to auscultation       Cardiovascular negative cardio ROS Normal cardiovascular exam Rhythm:Regular Rate:Normal     Neuro/Psych negative neurological ROS  negative psych ROS   GI/Hepatic negative GI ROS, Neg liver ROS,   Endo/Other  negative endocrine ROS  Renal/GU negative Renal ROS  negative genitourinary   Musculoskeletal  (+) Arthritis ,   Abdominal   Peds negative pediatric ROS (+)  Hematology  (+) anemia ,   Anesthesia Other Findings   Reproductive/Obstetrics negative OB ROS                             Anesthesia Physical Anesthesia Plan  ASA: II  Anesthesia Plan: General   Post-op Pain Management:    Induction: Intravenous  Airway Management Planned: Oral ETT  Additional Equipment:   Intra-op Plan:   Post-operative Plan: Extubation in OR  Informed Consent: I have reviewed the patients History and Physical, chart, labs and discussed the procedure including the risks, benefits and alternatives for the proposed anesthesia with the patient or authorized representative who has indicated his/her understanding and acceptance.   Dental advisory given  Plan Discussed with: CRNA and Anesthesiologist  Anesthesia Plan Comments:        Anesthesia Quick Evaluation

## 2015-10-26 ENCOUNTER — Ambulatory Visit (HOSPITAL_BASED_OUTPATIENT_CLINIC_OR_DEPARTMENT_OTHER)
Admission: RE | Admit: 2015-10-26 | Discharge: 2015-10-26 | Disposition: A | Discharge status: Home / Self Care | Payer: Federal, State, Local not specified - PPO | Source: Ambulatory Visit | Attending: Obstetrics and Gynecology | Admitting: Obstetrics and Gynecology

## 2015-10-26 ENCOUNTER — Ambulatory Visit (HOSPITAL_BASED_OUTPATIENT_CLINIC_OR_DEPARTMENT_OTHER): Payer: Federal, State, Local not specified - PPO | Admitting: Anesthesiology

## 2015-10-26 ENCOUNTER — Encounter (HOSPITAL_BASED_OUTPATIENT_CLINIC_OR_DEPARTMENT_OTHER)
Admission: RE | Disposition: A | Discharge status: Home / Self Care | Payer: Self-pay | Source: Ambulatory Visit | Attending: Obstetrics and Gynecology

## 2015-10-26 ENCOUNTER — Encounter (HOSPITAL_BASED_OUTPATIENT_CLINIC_OR_DEPARTMENT_OTHER): Payer: Self-pay | Admitting: *Deleted

## 2015-10-26 DIAGNOSIS — Z79899 Other long term (current) drug therapy: Secondary | ICD-10-CM | POA: Diagnosis not present

## 2015-10-26 DIAGNOSIS — M1990 Unspecified osteoarthritis, unspecified site: Secondary | ICD-10-CM | POA: Diagnosis not present

## 2015-10-26 DIAGNOSIS — Z302 Encounter for sterilization: Secondary | ICD-10-CM | POA: Insufficient documentation

## 2015-10-26 DIAGNOSIS — Z881 Allergy status to other antibiotic agents status: Secondary | ICD-10-CM | POA: Insufficient documentation

## 2015-10-26 HISTORY — DX: Iron deficiency anemia, unspecified: D50.9

## 2015-10-26 HISTORY — DX: Bariatric surgery status: Z98.84

## 2015-10-26 HISTORY — DX: Personal history of urinary calculi: Z87.442

## 2015-10-26 HISTORY — DX: Gastritis, unspecified, without bleeding: K29.70

## 2015-10-26 HISTORY — PX: LAPAROSCOPIC TUBAL LIGATION: SHX1937

## 2015-10-26 HISTORY — DX: Low back pain: M54.5

## 2015-10-26 HISTORY — PX: REMOVAL OF DRUG DELIVERY IMPLANT: SHX6585

## 2015-10-26 HISTORY — DX: Other chronic pain: G89.29

## 2015-10-26 HISTORY — DX: Unspecified osteoarthritis, unspecified site: M19.90

## 2015-10-26 LAB — POCT PREGNANCY, URINE: Preg Test, Ur: NEGATIVE

## 2015-10-26 LAB — POCT HEMOGLOBIN-HEMACUE: HEMOGLOBIN: 10.6 g/dL — AB (ref 12.0–15.0)

## 2015-10-26 SURGERY — LIGATION, FALLOPIAN TUBE, LAPAROSCOPIC
Anesthesia: General | Site: Arm Upper | Laterality: Left

## 2015-10-26 MED ORDER — FENTANYL CITRATE (PF) 100 MCG/2ML IJ SOLN
INTRAMUSCULAR | Status: AC
  Filled 2015-10-26: qty 2

## 2015-10-26 MED ORDER — ARTIFICIAL TEARS OP OINT
TOPICAL_OINTMENT | OPHTHALMIC | Status: AC
  Filled 2015-10-26: qty 3.5

## 2015-10-26 MED ORDER — LACTATED RINGERS IV SOLN
INTRAVENOUS | Status: DC | PRN
  Administered 2015-10-26: 07:00:00 via INTRAVENOUS

## 2015-10-26 MED ORDER — KETOROLAC TROMETHAMINE 30 MG/ML IJ SOLN
INTRAMUSCULAR | Status: DC | PRN
  Administered 2015-10-26: 30 mg via INTRAVENOUS

## 2015-10-26 MED ORDER — PROMETHAZINE HCL 25 MG/ML IJ SOLN
INTRAMUSCULAR | Status: AC
Start: 2015-10-26 — End: 2015-10-26
  Filled 2015-10-26: qty 1

## 2015-10-26 MED ORDER — MIDAZOLAM HCL 2 MG/2ML IJ SOLN
INTRAMUSCULAR | Status: AC
  Filled 2015-10-26: qty 2

## 2015-10-26 MED ORDER — FENTANYL CITRATE (PF) 100 MCG/2ML IJ SOLN
INTRAMUSCULAR | Status: AC
Start: 2015-10-26 — End: 2015-10-26
  Filled 2015-10-26: qty 2

## 2015-10-26 MED ORDER — KETOROLAC TROMETHAMINE 30 MG/ML IJ SOLN
INTRAMUSCULAR | Status: AC
  Filled 2015-10-26: qty 1

## 2015-10-26 MED ORDER — SUCCINYLCHOLINE CHLORIDE 200 MG/10ML IV SOSY
PREFILLED_SYRINGE | INTRAVENOUS | Status: DC | PRN
  Administered 2015-10-26: 100 mg via INTRAVENOUS

## 2015-10-26 MED ORDER — DEXAMETHASONE SODIUM PHOSPHATE 10 MG/ML IJ SOLN
INTRAMUSCULAR | Status: AC
  Filled 2015-10-26: qty 1

## 2015-10-26 MED ORDER — PROMETHAZINE HCL 25 MG/ML IJ SOLN
6.2500 mg | INTRAMUSCULAR | Status: DC | PRN
  Administered 2015-10-26: 6.25 mg via INTRAVENOUS
  Filled 2015-10-26: qty 1

## 2015-10-26 MED ORDER — DEXAMETHASONE SODIUM PHOSPHATE 4 MG/ML IJ SOLN
INTRAMUSCULAR | Status: DC | PRN
  Administered 2015-10-26: 10 mg via INTRAVENOUS

## 2015-10-26 MED ORDER — FENTANYL CITRATE (PF) 100 MCG/2ML IJ SOLN
25.0000 ug | INTRAMUSCULAR | Status: DC | PRN
  Administered 2015-10-26 (×2): 50 ug via INTRAVENOUS
  Administered 2015-10-26: 25 ug via INTRAVENOUS
  Administered 2015-10-26 (×2): 50 ug via INTRAVENOUS
  Filled 2015-10-26: qty 1

## 2015-10-26 MED ORDER — GLYCOPYRROLATE 0.2 MG/ML IJ SOLN
INTRAMUSCULAR | Status: DC | PRN
  Administered 2015-10-26: 0.2 mg via INTRAVENOUS

## 2015-10-26 MED ORDER — SCOPOLAMINE 1 MG/3DAYS TD PT72
1.0000 | MEDICATED_PATCH | TRANSDERMAL | Status: DC
  Administered 2015-10-26: 1.5 mg via TRANSDERMAL
  Administered 2015-10-26: 1 via TRANSDERMAL
  Filled 2015-10-26: qty 1

## 2015-10-26 MED ORDER — LACTATED RINGERS IV SOLN
INTRAVENOUS | Status: DC
  Administered 2015-10-26: 11:00:00 via INTRAVENOUS
  Filled 2015-10-26: qty 1000

## 2015-10-26 MED ORDER — LIDOCAINE 2% (20 MG/ML) 5 ML SYRINGE
INTRAMUSCULAR | Status: DC | PRN
  Administered 2015-10-26: 50 mg via INTRAVENOUS

## 2015-10-26 MED ORDER — LIDOCAINE HCL (CARDIAC) 20 MG/ML IV SOLN
INTRAVENOUS | Status: AC
  Filled 2015-10-26: qty 5

## 2015-10-26 MED ORDER — BUPIVACAINE HCL (PF) 0.25 % IJ SOLN
INTRAMUSCULAR | Status: DC | PRN
Start: 2015-10-26 — End: 2015-10-26
  Administered 2015-10-26: 10 mL

## 2015-10-26 MED ORDER — OXYCODONE HCL 5 MG PO TABS
ORAL_TABLET | ORAL | Status: AC
  Filled 2015-10-26: qty 1

## 2015-10-26 MED ORDER — PROPOFOL 10 MG/ML IV BOLUS
INTRAVENOUS | Status: AC
  Filled 2015-10-26: qty 40

## 2015-10-26 MED ORDER — FENTANYL CITRATE (PF) 250 MCG/5ML IJ SOLN
INTRAMUSCULAR | Status: AC
  Filled 2015-10-26: qty 5

## 2015-10-26 MED ORDER — OXYCODONE-ACETAMINOPHEN 2.5-325 MG PO TABS: 1.0000 | ORAL_TABLET | ORAL | Status: AC | PRN

## 2015-10-26 MED ORDER — FENTANYL CITRATE (PF) 100 MCG/2ML IJ SOLN
INTRAMUSCULAR | Status: DC | PRN
  Administered 2015-10-26 (×2): 50 ug via INTRAVENOUS

## 2015-10-26 MED ORDER — GLYCOPYRROLATE 0.2 MG/ML IJ SOLN
INTRAMUSCULAR | Status: AC
  Filled 2015-10-26: qty 1

## 2015-10-26 MED ORDER — PROPOFOL 10 MG/ML IV BOLUS
INTRAVENOUS | Status: DC | PRN
  Administered 2015-10-26: 150 mg via INTRAVENOUS

## 2015-10-26 MED ORDER — ACETAMINOPHEN 500 MG PO TABS
ORAL_TABLET | ORAL | Status: AC
  Filled 2015-10-26: qty 2

## 2015-10-26 MED ORDER — ONDANSETRON HCL 4 MG/2ML IJ SOLN
INTRAMUSCULAR | Status: AC
  Filled 2015-10-26: qty 2

## 2015-10-26 MED ORDER — CEFAZOLIN SODIUM-DEXTROSE 2-4 GM/100ML-% IV SOLN
INTRAVENOUS | Status: AC
  Filled 2015-10-26: qty 100

## 2015-10-26 MED ORDER — OXYCODONE HCL 5 MG PO TABS
5.0000 mg | ORAL_TABLET | Freq: Once | ORAL | Status: AC
Start: 2015-10-26 — End: 2015-10-26
  Administered 2015-10-26: 5 mg via ORAL
  Filled 2015-10-26: qty 1

## 2015-10-26 MED ORDER — ONDANSETRON HCL 4 MG/2ML IJ SOLN
INTRAMUSCULAR | Status: DC | PRN
  Administered 2015-10-26: 4 mg via INTRAVENOUS

## 2015-10-26 MED ORDER — SCOPOLAMINE 1 MG/3DAYS TD PT72
MEDICATED_PATCH | TRANSDERMAL | Status: AC
  Filled 2015-10-26: qty 1

## 2015-10-26 MED ORDER — ACETAMINOPHEN 500 MG PO TABS
1000.0000 mg | ORAL_TABLET | Freq: Once | ORAL | Status: AC
Start: 2015-10-26 — End: 2015-10-26
  Administered 2015-10-26: 1000 mg via ORAL
  Filled 2015-10-26: qty 2

## 2015-10-26 MED ORDER — FENTANYL CITRATE (PF) 100 MCG/2ML IJ SOLN
50.0000 ug | Freq: Once | INTRAMUSCULAR | Status: AC
  Administered 2015-10-26: 50 ug via INTRAVENOUS
  Filled 2015-10-26: qty 1

## 2015-10-26 SURGICAL SUPPLY — 74 items
APPLICATOR COTTON TIP 6IN STRL (MISCELLANEOUS) ×4 IMPLANT
BAG URINE DRAINAGE (UROLOGICAL SUPPLIES) IMPLANT
BANDAGE ADHESIVE 1X3 (GAUZE/BANDAGES/DRESSINGS) IMPLANT
BANDAGE CO FLEX L/F 2IN X 5YD (GAUZE/BANDAGES/DRESSINGS) ×4 IMPLANT
BLADE CLIPPER SURG (BLADE) IMPLANT
BLADE SURG 11 STRL SS (BLADE) ×4 IMPLANT
BNDG ADH 7/8 SPOT LF (GAUZE/BANDAGES/DRESSINGS) ×4 IMPLANT
CANISTER SUCTION 2500CC (MISCELLANEOUS) IMPLANT
CATH FOLEY 2WAY SLVR  5CC 16FR (CATHETERS)
CATH FOLEY 2WAY SLVR 5CC 16FR (CATHETERS) IMPLANT
CATH ROBINSON RED A/P 16FR (CATHETERS) IMPLANT
CLIP FILSHIE TUBAL LIGA STRL (Clip) ×4 IMPLANT
CLOSURE WOUND 1/4X4 (GAUZE/BANDAGES/DRESSINGS)
CLOTH BEACON ORANGE TIMEOUT ST (SAFETY) ×4 IMPLANT
DRAPE UNDERBUTTOCKS STRL (DRAPE) ×4 IMPLANT
DRSG OPSITE POSTOP 3X4 (GAUZE/BANDAGES/DRESSINGS) IMPLANT
DRSG TELFA 3X8 NADH (GAUZE/BANDAGES/DRESSINGS) IMPLANT
ELECT REM PT RETURN 9FT ADLT (ELECTROSURGICAL) ×4
ELECTRODE REM PT RTRN 9FT ADLT (ELECTROSURGICAL) ×2 IMPLANT
FILTER SMOKE EVAC LAPAROSHD (FILTER) IMPLANT
GLOVE BIO SURGEON STRL SZ 6.5 (GLOVE) ×3 IMPLANT
GLOVE BIO SURGEON STRL SZ7 (GLOVE) ×8 IMPLANT
GLOVE BIO SURGEONS STRL SZ 6.5 (GLOVE) ×1
GLOVE INDICATOR 6.5 STRL GRN (GLOVE) ×8 IMPLANT
GLOVE INDICATOR 7.5 STRL GRN (GLOVE) ×4 IMPLANT
GLOVE SURG SS PI 7.0 STRL IVOR (GLOVE) ×4 IMPLANT
GOWN STRL REUS W/ TWL LRG LVL3 (GOWN DISPOSABLE) ×4 IMPLANT
GOWN STRL REUS W/ TWL XL LVL3 (GOWN DISPOSABLE) ×2 IMPLANT
GOWN STRL REUS W/TWL LRG LVL3 (GOWN DISPOSABLE) ×4
GOWN STRL REUS W/TWL XL LVL3 (GOWN DISPOSABLE) ×2
KIT ROOM TURNOVER WOR (KITS) ×4 IMPLANT
LIQUID BAND (GAUZE/BANDAGES/DRESSINGS) IMPLANT
MANIFOLD NEPTUNE II (INSTRUMENTS) IMPLANT
NEEDLE HYPO 25X1 1.5 SAFETY (NEEDLE) ×4 IMPLANT
NEEDLE INSUFFLATION 14GA 120MM (NEEDLE) ×4 IMPLANT
NEEDLE INSUFFLATION 14GA 150MM (NEEDLE) IMPLANT
NS IRRIG 500ML POUR BTL (IV SOLUTION) ×4 IMPLANT
PACK BASIN DAY SURGERY FS (CUSTOM PROCEDURE TRAY) ×4 IMPLANT
PACK LAPAROSCOPY II (CUSTOM PROCEDURE TRAY) ×4 IMPLANT
PAD OB MATERNITY 4.3X12.25 (PERSONAL CARE ITEMS) ×4 IMPLANT
PAD PREP 24X48 CUFFED NSTRL (MISCELLANEOUS) ×4 IMPLANT
PADDING ION DISPOSABLE (MISCELLANEOUS) ×4 IMPLANT
PENCIL BUTTON HOLSTER BLD 10FT (ELECTRODE) IMPLANT
POUCH SPECIMEN RETRIEVAL 10MM (ENDOMECHANICALS) IMPLANT
SCALPEL HARMONIC ACE (MISCELLANEOUS) IMPLANT
SCISSORS LAP 5X35 DISP (ENDOMECHANICALS) IMPLANT
SEALER TISSUE G2 CVD JAW 35 (ENDOMECHANICALS) IMPLANT
SEALER TISSUE G2 CVD JAW 45CM (ENDOMECHANICALS) IMPLANT
SET IRRIG TUBING LAPAROSCOPIC (IRRIGATION / IRRIGATOR) IMPLANT
SOLUTION ANTI FOG 6CC (MISCELLANEOUS) ×4 IMPLANT
SPONGE GAUZE 4X4 12PLY STER LF (GAUZE/BANDAGES/DRESSINGS) ×4 IMPLANT
STRIP CLOSURE SKIN 1/4X4 (GAUZE/BANDAGES/DRESSINGS) IMPLANT
SUT MNCRL AB 3-0 PS2 18 (SUTURE) ×4 IMPLANT
SUT MNCRL AB 4-0 PS2 18 (SUTURE) ×4 IMPLANT
SUT MON AB 2-0 CT1 36 (SUTURE) IMPLANT
SUT PLAIN 4 0 FS 2 27 (SUTURE) IMPLANT
SUT VIC AB 2-0 UR6 27 (SUTURE) IMPLANT
SUT VICRYL 0 TIES 12 18 (SUTURE) IMPLANT
SUT VICRYL 0 UR6 27IN ABS (SUTURE) IMPLANT
SUT VICRYL RAPIDE 3 0 (SUTURE) IMPLANT
SUT VICRYL RAPIDE 4/0 PS 2 (SUTURE) ×8 IMPLANT
SYR 3ML 23GX1 SAFETY (SYRINGE) IMPLANT
SYR CONTROL 10ML LL (SYRINGE) ×4 IMPLANT
SYRINGE 10CC LL (SYRINGE) ×4 IMPLANT
TOWEL OR 17X24 6PK STRL BLUE (TOWEL DISPOSABLE) ×8 IMPLANT
TRAY DSU PREP LF (CUSTOM PROCEDURE TRAY) ×4 IMPLANT
TROCAR OPTI TIP 5M 100M (ENDOMECHANICALS) IMPLANT
TROCAR XCEL BLUNT TIP 100MML (ENDOMECHANICALS) IMPLANT
TROCAR XCEL DIL TIP R 11M (ENDOMECHANICALS) ×4 IMPLANT
TUBE CONNECTING 12'X1/4 (SUCTIONS)
TUBE CONNECTING 12X1/4 (SUCTIONS) IMPLANT
TUBING INSUFFLATION 10FT LAP (TUBING) ×4 IMPLANT
VACUUM HOSE/TUBING 7/8INX6FT (MISCELLANEOUS) IMPLANT
WATER STERILE IRR 500ML POUR (IV SOLUTION) ×4 IMPLANT

## 2015-10-26 NOTE — Op Note (Signed)
Preoperative diagnosis: Request permanent sterilization, request removal of    Postoperative diagnosis: Same  Procedure: Laparoscopy with tubal ligation, Filshie clips,  removal of NEXPLANION  Left upper extremity  Surgeon: Marcelle OverlieHolland  Anesthesia: Gen.  EBL: Less than 10 cc  Procedure and findings:  The patient taken the operating room after adequate level of general anesthesia was obtained with the patient  legs in stirrups, the abdomen perineum and vagina were prepped and draped in usual manner for laparoscopy the bladder was drained, EUA was carried out uterus is midposterior normal size adnexa negative. Hulka tenaculum was positioned. This was done after appropriate timeouts were taken.  Attention directed to the abdomen, the subumbilical area was infiltrated with quarter percent Marcaine plain, small incision was made varies needle was introduced that difficulty, its intra-abdominal position was verified by pressure water testing. After a 3 L pneumoperitoneum syncopated, lap scopic trocar and sleeve were then introduced that difficulty. There was no evidence of any bleeding or trauma with the patient in Trendelenburg, the uterus is anteflexed the pelvic findings were unremarkable quarter percent Marcaine was dripped across each tube from the cornu to the fimbriated end, Filshie clip was backloaded applied to the right tube 2 cm from the cornu at a right ankle completely engulfing the tube with excellent application exact same repeated on the opposite side after carefully identifying the tube from its origin to fimbriated end on each side. These areas were photo documented, instruments removed, gas allowed to escape the incision closed with a 4-0 Monocryl subcuticular closure.  Left upper extremity was exposed prepped and draped , the NEXPLANON devicei Could be palpated at the old insertion site Marcaine was used small incision was made this is removed intact with a hemostat. Steri-Strips and a  pressure dressing applied she tolerated this well went to recovery room in good condition.  Dictated with [facility]  [name] Curry [name] ObeyM Landis Curry [name].D.

## 2015-10-26 NOTE — Anesthesia Procedure Notes (Signed)
Procedure Name: Intubation Date/Time: 10/26/2015 7:48 AM Performed by: Tyrone NineSAUVE, Linnie Mcglocklin F Pre-anesthesia Checklist: Patient identified, Emergency Drugs available, Suction available, Patient being monitored and Timeout performed Patient Re-evaluated:Patient Re-evaluated prior to inductionOxygen Delivery Method: Circle system utilized Preoxygenation: Pre-oxygenation with 100% oxygen Intubation Type: IV induction Ventilation: Mask ventilation without difficulty Laryngoscope Size: Mac and 3 Grade View: Grade I Tube type: Oral Tube size: 7.0 mm Number of attempts: 1 Airway Equipment and Method: Stylet Placement Confirmation: breath sounds checked- equal and bilateral,  ETT inserted through vocal cords under direct vision and positive ETCO2 Secured at: 20 cm Tube secured with: Tape Dental Injury: Teeth and Oropharynx as per pre-operative assessment

## 2015-10-26 NOTE — Discharge Instructions (Signed)
Post Anesthesia Home Care Instructions  Activity: Get plenty of rest for the remainder of the day. A responsible adult should stay with you for 24 hours following the procedure.  For the next 24 hours, DO NOT: -Drive a car -Advertising copywriterperate machinery -Drink alcoholic beverages -Take any medication unless instructed by your physician -Make any legal decisions or sign important papers.  Meals: Start with liquid foods such as gelatin or soup. Progress to regular foods as tolerated. Avoid greasy, spicy, heavy foods. If nausea and/or vomiting occur, drink only clear liquids until the nausea and/or vomiting subsides. Call your physician if vomiting continues.  Special Instructions/Symptoms: Your throat may feel dry or sore from the anesthesia or the breathing tube placed in your throat during surgery. If this causes discomfort, gargle with warm salt water. The discomfort should disappear within 24 hours.  If you had a scopolamine patch placed behind your ear for the management of post- operative nausea and/or vomiting:  1. The medication in the patch is effective for 72 hours, after which it should be removed.  Wrap patch in a tissue and discard in the trash. Wash hands thoroughly with soap and water. 2. You may remove the patch earlier than 72 hours if you experience unpleasant side effects which may include dry mouth, dizziness or visual disturbances. 3. Avoid touching the patch. Wash your hands with soap and water after contact with the patch.   Laparoscopic Tubal, Care After Refer to this sheet in the next few weeks. These instructions provide you with information about caring for yourself after your procedure. Your health care provider may also give you more specific instructions. Your treatment has been planned according to current medical practices, but problems sometimes occur. Call your health care provider if you have any problems or questions after your procedure. WHAT TO EXPECT AFTER THE  PROCEDURE After your procedure, it is common to have:  Sore throat.  Soreness at the incision site.  Mild cramping.  Tiredness.  Mild nausea or vomiting.  Shoulder pain. HOME CARE INSTRUCTIONS  Rest for the remainder of the day.  Take medicines only as directed by your health care provider. These include over-the-counter medicines and prescription medicines. Do not take aspirin, which can cause bleeding.  Over the next few days, gradually return to your normal activities and your normal diet.  Avoid sexual intercourse for 2 weeks or as directed by your health care provider.  Do not use tampons, and do not douche.  Do not drive or operate heavy machinery while taking pain medicine.  Do not lift anything that is heavier than 5 lb (2.3 kg) for 2 weeks or as directed by your health care provider.  Do not take baths. Take showers only. Ask your health care provider when you can start taking baths.  Try to have help for your household needs for the first 7-10 days.  There are many different ways to close and cover an incision, including stitches (sutures), skin glue, and adhesive strips. Follow instructions from your health care provider about:  Incision care.  Bandage (dressing) changes and removal.  Incision closure removal.  Check your incision area every day for signs of infection. Watch for:  Redness, swelling, or pain.  Fluid, blood, or pus.  Keep all follow-up visits as directed by your health care provider. SEEK MEDICAL CARE IF:  You have redness, swelling, or increasing pain in your incision area.  You have fluid, blood, or pus coming from your incision for longer than 1  day.  You notice a bad smell coming from your incision or your dressing.  The edges of your incision break open after the sutures have been removed.  Your pain does not decrease after 2-3 days.  You have a rash.  You repeatedly become dizzy or light-headed.  You have a reaction to  your medicine.  Your pain medicine is not helping.  You are constipated. SEEK IMMEDIATE MEDICAL CARE IF:  You have a fever.  You faint.  You have increasing pain in your abdomen.  You have severe pain in one or both of your shoulders.  You have bleeding or drainage from your suture sites or your vagina after surgery.  You have shortness of breath or have difficulty breathing.  You have chest pain or leg pain.  You have ongoing nausea, vomiting, or diarrhea.   This information is not intended to replace advice given to you by your health care provider. Make sure you discuss any questions you have with your health care provider.   Document Released: 10/25/2004 Document Revised: 08/22/2014 Document Reviewed: 07/19/2011 Elsevier Interactive Patient Education [employer]2016 Elsevier Inc.

## 2015-10-26 NOTE — Addendum Note (Signed)
Addendum  created 10/26/15 1210 by Sherrian DiversBruce Eeshan Verbrugge, MD   Modules edited: Orders

## 2015-10-26 NOTE — Transfer of Care (Signed)
Immediate Anesthesia Transfer of Care Note  Patient: Otelia Sergeantdessa H Elem  Procedure(s) Performed: Procedure(s): LAPAROSCOPIC TUBAL LIGATION with Filshie Clips (Bilateral) REMOVAL OF DRUG DELIVERY IMPLANT -nexplanon removal  from left arm (Left)  Patient Location: PACU  Anesthesia Type:General  Level of Consciousness: awake, alert , oriented and patient cooperative  Airway & Oxygen Therapy: Patient Spontanous Breathing and Patient connected to nasal cannula oxygen  Post-op Assessment: Report given to RN and Post -op Vital signs reviewed and stable  Post vital signs: Reviewed and stable  Last Vitals:  Filed Vitals:   10/26/15 0652  BP: 114/64  Pulse: 76  Temp: 36.4 C  Resp: 16    Last Pain: There were no vitals filed for this visit.       Complications: No apparent anesthesia complications

## 2015-10-26 NOTE — Anesthesia Postprocedure Evaluation (Signed)
Anesthesia Post Note  Patient: [name] Curry  Procedure(s) Performed: Procedure(s) (LRB): LAPAROSCOPIC TUBAL LIGATION with Filshie Clips (Bilateral) REMOVAL OF DRUG DELIVERY IMPLANT -nexplanon removal  from left arm (Left)  Patient location during evaluation: PACU Anesthesia Type: General Level of consciousness: awake and alert Pain management: pain level controlled Vital Signs Assessment: post-procedure vital signs reviewed and stable Respiratory status: spontaneous breathing, nonlabored ventilation, respiratory function stable and patient connected to nasal cannula oxygen Cardiovascular status: blood pressure returned to baseline and stable Anesthetic complications: no Comments: Phenergan for nausea. Scopolamine patch in place.    Last Vitals:  Filed Vitals:   10/26/15 0900 10/26/15 0915  BP: 120/74 120/77  Pulse: 83 75  Temp:    Resp: 23 11    Last Pain:  Filed Vitals:   10/26/15 0916  PainSc: 6                  Madyn Ivins J

## 2015-10-26 NOTE — Progress Notes (Signed)
The patient was re-examined with no change in status 

## 2015-10-26 NOTE — Addendum Note (Signed)
Addendum  created 10/26/15 [id number] by Tyrone Nineobin F Lea Walbert, CRNA   Modules edited: Anesthesia LDA, Lines/Drains/Airways Properties Editor   Lines/Drains/Airways Properties Editor:  Properties of line/drain/airway/wound Peripheral IV 10/26/15 Right Hand have been modified.

## 2015-11-01 ENCOUNTER — Encounter (HOSPITAL_BASED_OUTPATIENT_CLINIC_OR_DEPARTMENT_OTHER): Payer: Self-pay | Admitting: Obstetrics and Gynecology

## 2015-12-04 ENCOUNTER — Other Ambulatory Visit: Payer: Self-pay | Admitting: Orthopaedic Surgery

## 2015-12-04 DIAGNOSIS — M545 Low back pain: Secondary | ICD-10-CM

## 2015-12-17 ENCOUNTER — Ambulatory Visit
Admission: RE | Admit: 2015-12-17 | Discharge: 2015-12-17 | Disposition: A | Discharge status: Home / Self Care | Payer: Federal, State, Local not specified - PPO | Source: Ambulatory Visit | Attending: Orthopaedic Surgery | Admitting: Orthopaedic Surgery

## 2015-12-17 DIAGNOSIS — M545 Low back pain: Secondary | ICD-10-CM
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
## 2017-07-09 ENCOUNTER — Encounter (INDEPENDENT_AMBULATORY_CARE_PROVIDER_SITE_OTHER): Payer: Self-pay | Admitting: Orthopaedic Surgery

## 2017-07-09 ENCOUNTER — Ambulatory Visit (INDEPENDENT_AMBULATORY_CARE_PROVIDER_SITE_OTHER): Payer: Federal, State, Local not specified - PPO | Admitting: Orthopaedic Surgery

## 2017-07-09 ENCOUNTER — Ambulatory Visit (INDEPENDENT_AMBULATORY_CARE_PROVIDER_SITE_OTHER): Payer: Self-pay

## 2017-07-09 VITALS — BP 109/74 | HR 77 | Ht 65.0 in | Wt 183.0 lb

## 2017-07-09 DIAGNOSIS — M545 Low back pain: Secondary | ICD-10-CM

## 2017-07-09 DIAGNOSIS — M542 Cervicalgia: Secondary | ICD-10-CM
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
## 2018-09-08 ENCOUNTER — Other Ambulatory Visit: Payer: Self-pay | Admitting: Obstetrics and Gynecology

## 2018-09-08 DIAGNOSIS — R928 Other abnormal and inconclusive findings on diagnostic imaging of breast: Secondary | ICD-10-CM

## 2018-09-14 ENCOUNTER — Other Ambulatory Visit: Payer: Self-pay

## 2018-09-14 ENCOUNTER — Ambulatory Visit
Admission: RE | Admit: 2018-09-14 | Discharge: 2018-09-14 | Disposition: A | Discharge status: Home / Self Care | Payer: Federal, State, Local not specified - PPO | Source: Ambulatory Visit | Attending: Obstetrics and Gynecology | Admitting: Obstetrics and Gynecology

## 2018-09-14 ENCOUNTER — Ambulatory Visit: Payer: Federal, State, Local not specified - PPO

## 2018-09-14 DIAGNOSIS — R928 Other abnormal and inconclusive findings on diagnostic imaging of breast: Secondary | ICD-10-CM

## 2018-11-26 ENCOUNTER — Other Ambulatory Visit: Payer: Self-pay

## 2018-11-26 DIAGNOSIS — Z20822 Contact with and (suspected) exposure to covid-19: Secondary | ICD-10-CM

## 2018-11-27 LAB — NOVEL CORONAVIRUS, NAA: SARS-CoV-2, NAA: NOT DETECTED

## 2019-02-15 ENCOUNTER — Other Ambulatory Visit: Payer: Self-pay

## 2019-02-15 DIAGNOSIS — Z20822 Contact with and (suspected) exposure to covid-19: Secondary | ICD-10-CM

## 2019-02-17 LAB — NOVEL CORONAVIRUS, NAA: SARS-CoV-2, NAA: NOT DETECTED

## 2021-08-04 ENCOUNTER — Other Ambulatory Visit: Payer: Self-pay

## 2021-08-04 ENCOUNTER — Encounter (HOSPITAL_BASED_OUTPATIENT_CLINIC_OR_DEPARTMENT_OTHER): Payer: Self-pay | Admitting: *Deleted

## 2021-08-04 ENCOUNTER — Emergency Department (HOSPITAL_BASED_OUTPATIENT_CLINIC_OR_DEPARTMENT_OTHER)
Admission: EM | Admit: 2021-08-04 | Discharge: 2021-08-04 | Disposition: A | Discharge status: Home / Self Care | Payer: Federal, State, Local not specified - PPO | Attending: Emergency Medicine | Admitting: Emergency Medicine

## 2021-08-04 DIAGNOSIS — Z87442 Personal history of urinary calculi: Secondary | ICD-10-CM | POA: Insufficient documentation

## 2021-08-04 DIAGNOSIS — M79644 Pain in right finger(s): Secondary | ICD-10-CM | POA: Diagnosis present

## 2021-08-04 DIAGNOSIS — L03011 Cellulitis of right finger: Secondary | ICD-10-CM | POA: Diagnosis not present

## 2021-08-04 MED ORDER — SULFAMETHOXAZOLE-TRIMETHOPRIM 800-160 MG PO TABS: 1.0000 | ORAL_TABLET | Freq: Two times a day (BID) | ORAL | 0 refills | Status: AC

## 2021-08-04 NOTE — Discharge Instructions (Signed)
You were seen today for paronychia.  It is already draining.  Continue warm soaks.  You will be started on an antibiotic given the discoloration of your finger.  You may ultimately lose her fingernail.  If you have any new or worsening symptoms, you should be reevaluated. ?

## 2021-08-04 NOTE — ED Notes (Signed)
Pt verbalizes understanding of discharge instructions. Opportunity for questioning and answers were provided. Pt discharged from ED to home.   ? ?
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
## 2021-08-04 NOTE — ED Triage Notes (Signed)
Pt has a paronychia on her right ring finger x7 days, there was some purulent drainage earlier in the week.   ?
